# Patient Record
Sex: Male | Born: 1980 | Race: White | Hispanic: No | Marital: Married | Smoking: Current every day smoker
Health system: Southern US, Community
[De-identification: ages and names within clinical notes are randomized; demographics above are authoritative.]

## PROBLEM LIST (undated history)

## (undated) DIAGNOSIS — F32A Depression, unspecified: Secondary | ICD-10-CM

## (undated) DIAGNOSIS — F329 Major depressive disorder, single episode, unspecified: Secondary | ICD-10-CM

## (undated) DIAGNOSIS — F419 Anxiety disorder, unspecified: Secondary | ICD-10-CM

## (undated) HISTORY — DX: Depression, unspecified: F32.A

## (undated) HISTORY — DX: Major depressive disorder, single episode, unspecified: F32.9

## (undated) HISTORY — DX: Anxiety disorder, unspecified: F41.9

---
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
## 2003-02-11 ENCOUNTER — Emergency Department (HOSPITAL_COMMUNITY): Admission: EM | Admit: 2003-02-11 | Discharge: 2003-02-11 | Payer: Self-pay | Admitting: Emergency Medicine

## 2004-09-16 ENCOUNTER — Emergency Department (HOSPITAL_COMMUNITY): Admission: EM | Admit: 2004-09-16 | Discharge: 2004-09-16 | Payer: Self-pay | Admitting: Emergency Medicine

## 2005-01-16 ENCOUNTER — Emergency Department (HOSPITAL_COMMUNITY): Admission: EM | Admit: 2005-01-16 | Discharge: 2005-01-16 | Payer: Self-pay | Admitting: Emergency Medicine

## 2006-05-27 ENCOUNTER — Ambulatory Visit (HOSPITAL_BASED_OUTPATIENT_CLINIC_OR_DEPARTMENT_OTHER): Admission: RE | Admit: 2006-05-27 | Discharge: 2006-05-27 | Payer: Self-pay | Admitting: Otolaryngology

## 2010-11-07 ENCOUNTER — Other Ambulatory Visit: Payer: Self-pay | Admitting: Specialist

## 2013-05-22 ENCOUNTER — Ambulatory Visit (INDEPENDENT_AMBULATORY_CARE_PROVIDER_SITE_OTHER): Payer: BC Managed Care – PPO | Admitting: Family Medicine

## 2013-05-22 VITALS — BP 128/82 | HR 69 | Temp 98.3°F | Resp 16 | Ht 71.0 in | Wt 212.0 lb

## 2013-05-22 DIAGNOSIS — F4323 Adjustment disorder with mixed anxiety and depressed mood: Secondary | ICD-10-CM

## 2013-05-22 DIAGNOSIS — F32A Depression, unspecified: Secondary | ICD-10-CM

## 2013-05-22 DIAGNOSIS — F329 Major depressive disorder, single episode, unspecified: Secondary | ICD-10-CM

## 2013-05-22 DIAGNOSIS — F3289 Other specified depressive episodes: Secondary | ICD-10-CM

## 2013-05-22 MED ORDER — FLUOXETINE HCL 20 MG PO TABS: 20.0000 mg | ORAL_TABLET | Freq: Every day | ORAL | Status: DC

## 2013-05-22 MED ORDER — ALPRAZOLAM 0.25 MG PO TABS: 0.2500 mg | ORAL_TABLET | Freq: Every evening | ORAL | Status: DC | PRN

## 2013-05-22 NOTE — Progress Notes (Signed)
[facility] [address], [place] [address] [phone]  Date:  05/22/2013   Name:  Shawn CruiseRed L Reali   DOB:  1980-10-26   MRN:  [id number]  PCP:  No PCP Per Patient    Chief Complaint: Stress and Insomnia   History of Present Illness:  Shawn Logan is a 33 y.o. very pleasant male patient who presents with the following:  He is here today to discuss stress and depression that he has noted for a couple of months,  He has a hard time sleeping, feels stressed.  His grandfather is living with him and his mother because her grandfather has cancer.  Also, he is on furlough at work.    He does have a GF as well who is supportive.    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   He has had sx like this in the past- about 3 years ago he was treated with some xanax.  This did seem to help him and he was eventually able to stop it.    He is generally healthy He smokes a few cigarettes a day, drinks "may be a couple of beers on the weekend," no drugs.    He has not had any major health problems in the past- no operations He is having a hard time getting to sleep.  Once he goes to sleep he can stay asleep.   His appetite is pretty ok.   He does not have as much enjoyment of activities as he would like.   He does not have any SI.  "I would never do anything like that."    There are no active problems to display for this patient.   Past Medical History  Diagnosis Date  . Anxiety   . Depression     History reviewed. No pertinent past surgical history.  History  Substance Use Topics  . Smoking status: Current Every Day Smoker  . Smokeless tobacco: Not on file  . Alcohol Use: No    History reviewed. No pertinent family history.  No Known Allergies  Medication list has been reviewed and updated.  No current outpatient prescriptions on file prior to visit.   No current facility-administered medications on file prior to visit.    Review of Systems:  As per HPI- otherwise  negative.   Physical Examination: Filed Vitals:   05/22/13 1236  BP: 128/82  Pulse: 69  Temp: 98.3 F (36.8 C)  Resp: 16   Filed Vitals:   05/22/13 1236  Height: 5\' 11"  (1.803 m)  Weight: 212 lb (96.163 kg)   Body mass index is 29.58 kg/(m^2). Ideal Body Weight: Weight in (lb) to have BMI = 25: 178.9  GEN: WDWN, NAD, Non-toxic, A & O x 3 HEENT: Atraumatic, Normocephalic. Neck supple. No masses, No LAD. Ears and Nose: No external deformity. CV: RRR, No M/G/R. No JVD. No thrill. No extra heart sounds. PULM: CTA B, no wheezes, crackles, rhonchi. No retractions. No resp. distress. No accessory muscle use. EXTR: No c/c/e NEURO Normal gait.  PSYCH: Normally interactive. Conversant. Not depressed or anxious appearing.  Calm demeanor.    Assessment and Plan: Depression - Plan: FLUoxetine (PROZAC) 20 MG tablet  Adjustment disorder with mixed anxiety and depressed mood - Plan: ALPRAZolam (XANAX) 0.25 MG tablet  Start prozac, and xanax as needed for sleep.  Cautioned regarding sedation with xanax.   See patient instructions for more details.     Signed Abbe AmsterdamJessica Copland, MD

## 2013-05-22 NOTE — Patient Instructions (Signed)
Start taking the prozac 20 mg- after 2 weeks change to 40 mg a day.   Use the xanax as needed at bedtime.  Remember xanax can be habit forming and will make you feel sedated.  Try to use this for the minimum amount of time that you can.    Please come and see me in about a month to follow-up- Sooner if worse.

## 2014-06-18 ENCOUNTER — Ambulatory Visit (INDEPENDENT_AMBULATORY_CARE_PROVIDER_SITE_OTHER): Payer: BLUE CROSS/BLUE SHIELD | Admitting: Family Medicine

## 2014-06-18 VITALS — BP 136/70 | HR 64 | Temp 98.6°F | Resp 17 | Ht 71.0 in | Wt 209.8 lb

## 2014-06-18 DIAGNOSIS — F4323 Adjustment disorder with mixed anxiety and depressed mood: Secondary | ICD-10-CM | POA: Diagnosis not present

## 2014-06-18 DIAGNOSIS — F329 Major depressive disorder, single episode, unspecified: Secondary | ICD-10-CM | POA: Diagnosis not present

## 2014-06-18 DIAGNOSIS — F32A Depression, unspecified: Secondary | ICD-10-CM

## 2014-06-18 MED ORDER — FLUOXETINE HCL 20 MG PO TABS: 20.0000 mg | ORAL_TABLET | Freq: Every day | ORAL | Status: DC

## 2014-06-18 MED ORDER — ALPRAZOLAM 0.25 MG PO TABS: 0.2500 mg | ORAL_TABLET | Freq: Every evening | ORAL | Status: DC | PRN

## 2014-06-18 NOTE — Progress Notes (Signed)
Subjective:  This chart was scribed for Elvina Sidle MD, by Veverly Fells, at [facility].  This patient was seen in room 13 and the patient's care was started at 9:31 AM.    Patient ID: Shawn Logan, male    DOB: 09-20-1980, 34 y.o.   MRN: [id number] Chief Complaint  Patient presents with   Stress    off and on for years, past medications have worked for him in the past and wants to know if he can start them again.   Anxiety    off and on for years    Headache    2-3 times a week     HPI HPI Comments: Shawn Logan is a 34 y.o. male who [facility] complaining of stress and anxiety intermittently for years.  Patient states that he has increased stress because his sister has been using drugs and he has been taking care of her children.  He mostly feels worried and states that his mind is constantly racing which causes him to lose sleep at night.Per fiance, she thinks that he also feels down and unworthy often. 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  Patient is getting married in October and does not have any children. Patient has used Prozac and Xanax two years ago for about 5 months and states that they have helped him with his symptoms.  He did not feel like he felt the effects of Prozac fully. Patient is a smoker and has been smoking more lately.   He denies any change in appetite, heart racing. Patient works for [employer].   Past Medical History  Diagnosis Date   Anxiety    Depression     Current Outpatient Prescriptions on File Prior to Visit  Medication Sig Dispense Refill   ALPRAZolam (XANAX) 0.25 MG tablet Take 1 tablet (0.25 mg total) by mouth at bedtime as needed for anxiety. (Patient not taking: Reported on 06/18/2014) 30 tablet 1   FLUoxetine (PROZAC) 20 MG tablet Take 1 tablet (20 mg total) by mouth daily. Increase to 2  tablets after 2 weeks (Patient not taking: Reported on 06/18/2014) 60 tablet 3   No current facility-administered medications on file prior to visit.    No Known Allergies   Review of Systems  Constitutional: Negative for fever and chills.  Respiratory: Negative for cough and choking.   Cardiovascular: Negative for chest pain.  Gastrointestinal: Negative for nausea and vomiting.  Musculoskeletal: Negative for neck pain and neck stiffness.  Psychiatric/Behavioral: Positive for sleep disturbance.      Objective:   Physical Exam  Constitutional: He is oriented to person, place, and time. He appears well-developed and well-nourished. No distress.  HENT:  Head: Normocephalic and atraumatic.  Eyes: Conjunctivae and EOM are normal. Pupils are equal, round, and reactive to light.  Cardiovascular: Normal rate, regular rhythm and normal heart sounds.   No murmur heard. Pulmonary/Chest: Effort normal and breath sounds normal. No respiratory distress. He has no wheezes. He has no rales.  Abdominal: There is no tenderness.  Musculoskeletal: Normal range of motion.  Neurological: He is alert and oriented to person, place, and time.  Skin: Skin is warm and dry.  Psychiatric:  He is able to smile but his general body habit is one of shame with his head down and shoulders hunched.   Nursing note and vitals  reviewed.  Filed Vitals:   06/18/14 0901  BP: 136/70  Pulse: 64  Temp: 98.6 F (37 C)  TempSrc: Oral  Resp: 17  Height: 5\' 11"  (1.803 m)  Weight: 209 lb 12.8 oz (95.165 kg)  SpO2: 98%      Assessment & Plan:    This chart was scribed in my presence and reviewed by me personally.    ICD-9-CM ICD-10-CM   1. Depression 311 F32.9 FLUoxetine (PROZAC) 20 MG tablet  2. Adjustment disorder with mixed anxiety and depressed mood 309.28 F43.23 ALPRAZolam (XANAX) 0.25 MG tablet   Patient has a long history of anxiety. He's going to need some techniques to overcome this. The fact that he  did well before on Prozac and Xanax is a promising sign. I spent thirty minutes talking about anxiety with him feel like he is a good candidate for psychotherapy.  I referred the patient to doctors Vernell LeepKen Frazier and or Shanon RosserBarbara Farran for ongoing psychotherapy  Signed, Elvina SidleKurt Lauenstein, MD

## 2014-06-18 NOTE — Patient Instructions (Signed)
2 very good psychologists that you can see are Shawn Logan or Shawn Logan   Generalized Anxiety Disorder Generalized anxiety disorder (GAD) is a mental disorder. It interferes with life functions, including relationships, work, and school. GAD is different from normal anxiety, which everyone experiences at some point in their lives in response to specific life events and activities. Normal anxiety actually helps us prepare for and get through these life events and activities. Normal anxiety goes away after the event or activity is over.  GAD causes anxiety that is not necessarily related to specific events or activities. It also causes excess anxiety in proportion to specific events or activities. The anxiety associated with GAD is also difficult to control. GAD can vary from mild to severe. People with severe GAD can have intense waves of anxiety with physical symptoms (panic attacks).  SYMPTOMS The anxiety and worry associated with GAD are difficult to control. This anxiety and worry are related to many life events and activities and also occur more days than not for 6 months or longer. People with GAD also have three or more of the following symptoms (one or more in children):  Restlessness.   Fatigue.  Difficulty concentrating.   Irritability.  Muscle tension.  Difficulty sleeping or unsatisfying sleep. DIAGNOSIS GAD is diagnosed through an assessment by your health care provider. Your health care provider will ask you questions aboutyour mood,physical symptoms, and events in your life. Your health care provider may ask you about your medical history and use of alcohol or drugs, including prescription medicines. Your health care provider may also do a physical exam and blood tests. Certain medical conditions and the use of certain substances can cause symptoms similar to those associated with GAD. Your health care provider may refer you to a mental health specialist for further  evaluation. TREATMENT The following therapies are usually used to treat GAD:   Medication. Antidepressant medication usually is prescribed for long-term daily control. Antianxiety medicines may be added in severe cases, especially when panic attacks occur.   Talk therapy (psychotherapy). Certain types of talk therapy can be helpful in treating GAD by providing support, education, and guidance. A form of talk therapy called cognitive behavioral therapy can teach you healthy ways to think about and react to daily life events and activities.  Stress managementtechniques. These include yoga, meditation, and exercise and can be very helpful when they are practiced regularly. A mental health specialist can help determine which treatment is best for you. Some people see improvement with one therapy. However, other people require a combination of therapies. Document Released: 05/05/2012 Document Revised: 05/25/2013 Document Reviewed: 05/05/2012 [facility] Patient Information 2015 [employer] [place]. This information is not intended to replace advice given to you by your health care provider. Make sure you discuss any questions you have with your health care provider.

## 2014-10-06 ENCOUNTER — Other Ambulatory Visit: Payer: Self-pay

## 2014-10-06 DIAGNOSIS — F329 Major depressive disorder, single episode, unspecified: Secondary | ICD-10-CM

## 2014-10-06 DIAGNOSIS — F32A Depression, unspecified: Secondary | ICD-10-CM

## 2014-10-06 MED ORDER — FLUOXETINE HCL 20 MG PO TABS: 20.0000 mg | ORAL_TABLET | Freq: Every day | ORAL | Status: DC

## 2014-10-19 ENCOUNTER — Other Ambulatory Visit: Payer: Self-pay | Admitting: Family Medicine

## 2015-03-09 ENCOUNTER — Other Ambulatory Visit: Payer: Self-pay | Admitting: Physician Assistant

## 2015-04-08 ENCOUNTER — Observation Stay (HOSPITAL_COMMUNITY)
Admission: EM | Admit: 2015-04-08 | Discharge: 2015-04-10 | Disposition: A | Discharge status: Home / Self Care | Payer: BLUE CROSS/BLUE SHIELD | Attending: Family Medicine | Admitting: Family Medicine

## 2015-04-08 ENCOUNTER — Encounter (HOSPITAL_COMMUNITY): Payer: Self-pay | Admitting: Emergency Medicine

## 2015-04-08 ENCOUNTER — Emergency Department (HOSPITAL_COMMUNITY): Payer: BLUE CROSS/BLUE SHIELD

## 2015-04-08 DIAGNOSIS — F17200 Nicotine dependence, unspecified, uncomplicated: Secondary | ICD-10-CM | POA: Diagnosis present

## 2015-04-08 DIAGNOSIS — R001 Bradycardia, unspecified: Secondary | ICD-10-CM | POA: Diagnosis not present

## 2015-04-08 DIAGNOSIS — Z6828 Body mass index (BMI) 28.0-28.9, adult: Secondary | ICD-10-CM | POA: Diagnosis not present

## 2015-04-08 DIAGNOSIS — Z720 Tobacco use: Secondary | ICD-10-CM | POA: Diagnosis present

## 2015-04-08 DIAGNOSIS — Z8249 Family history of ischemic heart disease and other diseases of the circulatory system: Secondary | ICD-10-CM | POA: Insufficient documentation

## 2015-04-08 DIAGNOSIS — R42 Dizziness and giddiness: Secondary | ICD-10-CM | POA: Insufficient documentation

## 2015-04-08 DIAGNOSIS — I959 Hypotension, unspecified: Secondary | ICD-10-CM | POA: Diagnosis not present

## 2015-04-08 DIAGNOSIS — E669 Obesity, unspecified: Secondary | ICD-10-CM | POA: Diagnosis not present

## 2015-04-08 DIAGNOSIS — F419 Anxiety disorder, unspecified: Secondary | ICD-10-CM | POA: Insufficient documentation

## 2015-04-08 DIAGNOSIS — R079 Chest pain, unspecified: Secondary | ICD-10-CM | POA: Insufficient documentation

## 2015-04-08 DIAGNOSIS — F329 Major depressive disorder, single episode, unspecified: Secondary | ICD-10-CM | POA: Diagnosis not present

## 2015-04-08 DIAGNOSIS — D72829 Elevated white blood cell count, unspecified: Secondary | ICD-10-CM | POA: Diagnosis not present

## 2015-04-08 DIAGNOSIS — E785 Hyperlipidemia, unspecified: Secondary | ICD-10-CM | POA: Insufficient documentation

## 2015-04-08 DIAGNOSIS — F17210 Nicotine dependence, cigarettes, uncomplicated: Secondary | ICD-10-CM | POA: Insufficient documentation

## 2015-04-08 DIAGNOSIS — F1090 Alcohol use, unspecified, uncomplicated: Secondary | ICD-10-CM | POA: Insufficient documentation

## 2015-04-08 DIAGNOSIS — Z7289 Other problems related to lifestyle: Secondary | ICD-10-CM | POA: Insufficient documentation

## 2015-04-08 DIAGNOSIS — R55 Syncope and collapse: Principal | ICD-10-CM | POA: Insufficient documentation

## 2015-04-08 DIAGNOSIS — Z789 Other specified health status: Secondary | ICD-10-CM | POA: Insufficient documentation

## 2015-04-08 LAB — CBC
HCT: 47.1 % (ref 39.0–52.0)
Hemoglobin: 16 g/dL (ref 13.0–17.0)
MCH: 28.5 pg (ref 26.0–34.0)
MCHC: 34 g/dL (ref 30.0–36.0)
MCV: 83.8 fL (ref 78.0–100.0)
PLATELETS: 189 10*3/uL (ref 150–400)
RBC: 5.62 MIL/uL (ref 4.22–5.81)
RDW: 13 % (ref 11.5–15.5)
WBC: 11 10*3/uL — ABNORMAL HIGH (ref 4.0–10.5)

## 2015-04-08 LAB — URINALYSIS, ROUTINE W REFLEX MICROSCOPIC
Bilirubin Urine: NEGATIVE
GLUCOSE, UA: NEGATIVE mg/dL
HGB URINE DIPSTICK: NEGATIVE
KETONES UR: NEGATIVE mg/dL
Leukocytes, UA: NEGATIVE
Nitrite: NEGATIVE
PROTEIN: NEGATIVE mg/dL
Specific Gravity, Urine: 1.009 (ref 1.005–1.030)
pH: 6.5 (ref 5.0–8.0)

## 2015-04-08 LAB — BASIC METABOLIC PANEL
ANION GAP: 15 (ref 5–15)
BUN: 12 mg/dL (ref 6–20)
CALCIUM: 8.9 mg/dL (ref 8.9–10.3)
CO2: 19 mmol/L — ABNORMAL LOW (ref 22–32)
Chloride: 106 mmol/L (ref 101–111)
Creatinine, Ser: 1.09 mg/dL (ref 0.61–1.24)
GFR calc Af Amer: >60 mL/min (ref >60)
GLUCOSE: 90 mg/dL (ref 65–99)
Potassium: 4.1 mmol/L (ref 3.5–5.1)
SODIUM: 140 mmol/L (ref 135–145)

## 2015-04-08 LAB — RAPID URINE DRUG SCREEN, HOSP PERFORMED
Amphetamines: NOT DETECTED
Barbiturates: NOT DETECTED
Benzodiazepines: NOT DETECTED
Cocaine: NOT DETECTED
OPIATES: NOT DETECTED
Tetrahydrocannabinol: POSITIVE — AB

## 2015-04-08 LAB — CBG MONITORING, ED: Glucose-Capillary: 85 mg/dL (ref 65–99)

## 2015-04-08 NOTE — ED Provider Notes (Signed)
CSN: [id number]     Arrival date & time 04/08/15  1953 History   First MD Initiated Contact with Patient 04/08/15 1959     Chief Complaint  Patient presents with  . Loss of Consciousness      The history is provided by the patient.  Patient is a healthy 35 year old male with no significant past medical history including no family history of early cardiac disease who presents the emergency department after a syncopal episode.  He began with 15-20 minutes of severe anterior chest pressure and discomfort without radiation followed by a sensation of lightheadedness.  He then had a syncopal episode where he lost complete consciousness and fell to the ground per the wife.  She states that after 2030 seconds he came to with wide eyes and had no recollection of the events.  She states that he is extremely pale and diaphoretic.  On EMS arrival the patient's blood pressure was 80/40 and his heart rate was in the 40s.  This time he feels much better.  He is still having a mild amount of tightness in the left side of his chest.  No recent illness.  Patient has had a normal week.  He does smoke cigarettes.  He denies cocaine use.  No headache at this time.  Denies pain.    Past Medical History  Diagnosis Date  . Anxiety   . Depression    History reviewed. No pertinent past surgical history. History reviewed. No pertinent family history. Social History  Substance Use Topics  . Smoking status: Current Every Day Smoker -- 1.00 packs/day    Types: Cigarettes  . Smokeless tobacco: None  . Alcohol Use: 7.2 oz/week    12 Cans of beer per week     Comment: weekend    Review of Systems  All other systems reviewed and are negative.     Allergies  Review of patient's allergies indicates no known allergies.  Home Medications   Prior to Admission medications   Medication Sig Start Date End Date Taking? Authorizing Provider  ALPRAZolam (XANAX) 0.25 MG tablet TAKE 1 TABLET BY MOUTH AT BEDTIME AS  NEEDED FOR ANXIETY. Patient not taking: Reported on 04/08/2015 10/20/14   Elvina Sidle, MD  FLUoxetine (PROZAC) 20 MG tablet Take 1 tablet (20 mg total) by mouth daily. Increase to 2 tablets after 2 weeks. PATIENT NEEDS OFFICE VISIT FOR ADDITIONAL REFILLS Patient not taking: Reported on 04/08/2015 10/06/14   Elvina Sidle, MD   BP 127/81 mmHg  Pulse 66  Temp(Src) 98.4 F (36.9 C) (Oral)  Resp 15  Ht  (1.854 m)  Wt 220 lb (99.791 kg)  BMI 29.03 kg/m2  SpO2 99% Physical Exam  Constitutional: He is oriented to person, place, and time. He appears well-developed and well-nourished.  HENT:  Head: Normocephalic and atraumatic.  Eyes: EOM are normal.  Neck: Normal range of motion.  Cardiovascular: Normal rate, regular rhythm, normal heart sounds and intact distal pulses.   Pulmonary/Chest: Effort normal and breath sounds normal. No respiratory distress.  Abdominal: Soft. He exhibits no distension. There is no tenderness.  Musculoskeletal: Normal range of motion.  Neurological: He is alert and oriented to person, place, and time.  Skin: Skin is warm and dry.  Psychiatric: He has a normal mood and affect. Judgment normal.  Nursing note and vitals reviewed.   ED Course  Procedures (including critical care time) Labs Review Labs Reviewed  BASIC METABOLIC PANEL - Abnormal; Notable for the following:  CO2 19 (*)    All other components within normal limits  CBC - Abnormal; Notable for the following:    WBC 11.0 (*)    All other components within normal limits  URINE RAPID DRUG SCREEN, HOSP PERFORMED - Abnormal; Notable for the following:    Tetrahydrocannabinol POSITIVE (*)    All other components within normal limits  URINALYSIS, ROUTINE W REFLEX MICROSCOPIC (NOT AT [facility])  TROPONIN I  CBG MONITORING, ED  Rosezena SensorI-STAT TROPOININ, ED    Imaging Review Dg Chest Portable 1 View  04/08/2015  CLINICAL DATA:  Chest pain EXAM: PORTABLE CHEST 1 VIEW COMPARISON:  01/16/2005 FINDINGS:  Cardiac shadow is enlarged in part by the portable technique. The lungs are well aerated with crowding of the vascular markings due to poor inspiratory effort. No focal infiltrate is seen. No acute bony abnormality is noted. IMPRESSION: No active disease. Electronically Signed   By: Alcide CleverMark  Lukens M.D.   On: 04/08/2015 21:27   I have personally reviewed and evaluated these images and lab results as part of my medical decision-making.  EKG Interpretation #1 Date/Time:  Friday April 08 2015 19:58:48 EDT Ventricular Rate:  65 PR Interval:  178 QRS Duration: 102 QT Interval:  402 QTC Calculation: 418 R Axis:   17 Text Interpretation:  Sinus rhythm No old tracing to compare Confirmed by  Idona Stach  MD, Josemanuel Eakins ([id number]) on 04/08/2015 8:30:32 PM     EKG Interpretation #2  Date/Time:  Friday April 08 2015 21:02:32 EDT Ventricular Rate:  66 PR Interval:  182 QRS Duration: 101 QT Interval:  404 QTC Calculation: 423 R Axis:   21 Text Interpretation:  Sinus rhythm No significant change was found Confirmed by Rakeem Colley  MD, Caryn BeeKEVIN ([id number]) on 04/08/2015 10:07:25 PM          MDM   Final diagnoses:  Syncope, unspecified syncope type  Chest pain, unspecified chest pain type    Concerning story for syncope.  Syncope with preceding chest pain followed by complete loss of consciousness.  He is bradycardic and hypotensive for any masses well.  Differential includes ventricular tachycardia or other abnormal ventricular arrhythmia versus vagal episode with severe GERD.  Given the concerning story he'll be observed overnight on telemetry.  Troponin negative.    Azalia BilisKevin Jeni Duling, MD 04/09/15 [phone]

## 2015-04-08 NOTE — ED Notes (Signed)
Pt brought to ED from home after having a syncope episode at home that last 30 sec while he was having dinner with his wife, pt was very diaphoretic, had an incontinence from urine at this time, pt denies any hx of seizures, BP 80/40, HR on 40's on EMS arrival to his house. Pt denies any pain, fever, chills, n/v/d.

## 2015-04-09 ENCOUNTER — Other Ambulatory Visit: Payer: Self-pay

## 2015-04-09 ENCOUNTER — Encounter (HOSPITAL_COMMUNITY): Payer: Self-pay

## 2015-04-09 DIAGNOSIS — E669 Obesity, unspecified: Secondary | ICD-10-CM | POA: Diagnosis not present

## 2015-04-09 DIAGNOSIS — Z720 Tobacco use: Secondary | ICD-10-CM | POA: Diagnosis not present

## 2015-04-09 DIAGNOSIS — R079 Chest pain, unspecified: Secondary | ICD-10-CM | POA: Diagnosis not present

## 2015-04-09 DIAGNOSIS — D72829 Elevated white blood cell count, unspecified: Secondary | ICD-10-CM | POA: Diagnosis present

## 2015-04-09 DIAGNOSIS — E785 Hyperlipidemia, unspecified: Secondary | ICD-10-CM | POA: Diagnosis not present

## 2015-04-09 DIAGNOSIS — F17200 Nicotine dependence, unspecified, uncomplicated: Secondary | ICD-10-CM | POA: Diagnosis present

## 2015-04-09 DIAGNOSIS — R55 Syncope and collapse: Secondary | ICD-10-CM | POA: Diagnosis present

## 2015-04-09 LAB — CBC WITH DIFFERENTIAL/PLATELET
BASOS ABS: 0.1 10*3/uL (ref 0.0–0.1)
BASOS PCT: 0 %
EOS ABS: 0.4 10*3/uL (ref 0.0–0.7)
Eosinophils Relative: 4 %
HCT: 45.5 % (ref 39.0–52.0)
HEMOGLOBIN: 15.2 g/dL (ref 13.0–17.0)
Lymphocytes Relative: 33 %
Lymphs Abs: 3.8 10*3/uL (ref 0.7–4.0)
MCH: 28.1 pg (ref 26.0–34.0)
MCHC: 33.4 g/dL (ref 30.0–36.0)
MCV: 84.1 fL (ref 78.0–100.0)
MONOS PCT: 9 %
Monocytes Absolute: 1.1 10*3/uL — ABNORMAL HIGH (ref 0.1–1.0)
NEUTROS ABS: 6.1 10*3/uL (ref 1.7–7.7)
NEUTROS PCT: 54 %
Platelets: 225 10*3/uL (ref 150–400)
RBC: 5.41 MIL/uL (ref 4.22–5.81)
RDW: 13.2 % (ref 11.5–15.5)
WBC: 11.5 10*3/uL — AB (ref 4.0–10.5)

## 2015-04-09 LAB — TROPONIN I: Troponin I: <0.03 ng/mL (ref <0.031)

## 2015-04-09 LAB — LIPID PANEL
CHOLESTEROL: 163 mg/dL (ref 0–200)
HDL: 34 mg/dL — AB (ref >40)
LDL Cholesterol: 85 mg/dL (ref 0–99)
TRIGLYCERIDES: 221 mg/dL — AB (ref <150)
Total CHOL/HDL Ratio: 4.8 RATIO
VLDL: 44 mg/dL — AB (ref 0–40)

## 2015-04-09 LAB — I-STAT TROPONIN, ED: Troponin i, poc: 0.01 ng/mL (ref 0.00–0.08)

## 2015-04-09 LAB — D-DIMER, QUANTITATIVE (NOT AT ARMC)

## 2015-04-09 LAB — MAGNESIUM: Magnesium: 1.9 mg/dL (ref 1.7–2.4)

## 2015-04-09 MED ORDER — VITAMIN B-1 100 MG PO TABS
100.0000 mg | ORAL_TABLET | Freq: Every day | ORAL | Status: DC
  Administered 2015-04-09: 100 mg via ORAL
  Filled 2015-04-09 (×2): qty 1

## 2015-04-09 MED ORDER — THIAMINE HCL 100 MG/ML IJ SOLN: 100.0000 mg | Freq: Every day | INTRAMUSCULAR | Status: DC

## 2015-04-09 MED ORDER — ASPIRIN EC 81 MG PO TBEC
81.0000 mg | DELAYED_RELEASE_TABLET | Freq: Every day | ORAL | Status: DC
  Administered 2015-04-10: 81 mg via ORAL
  Filled 2015-04-09: qty 1

## 2015-04-09 MED ORDER — ACETAMINOPHEN 325 MG PO TABS: 650.0000 mg | ORAL_TABLET | ORAL | Status: DC | PRN

## 2015-04-09 MED ORDER — NICOTINE 14 MG/24HR TD PT24
14.0000 mg | MEDICATED_PATCH | Freq: Every day | TRANSDERMAL | Status: DC
  Administered 2015-04-09: 14 mg via TRANSDERMAL
  Filled 2015-04-09 (×2): qty 1

## 2015-04-09 MED ORDER — ADULT MULTIVITAMIN W/MINERALS CH
1.0000 | ORAL_TABLET | Freq: Every day | ORAL | Status: DC
  Administered 2015-04-09: 1 via ORAL
  Filled 2015-04-09 (×2): qty 1

## 2015-04-09 MED ORDER — LORAZEPAM 2 MG/ML IJ SOLN: 1.0000 mg | Freq: Four times a day (QID) | INTRAMUSCULAR | Status: DC | PRN

## 2015-04-09 MED ORDER — ONDANSETRON HCL 4 MG/2ML IJ SOLN: 4.0000 mg | Freq: Four times a day (QID) | INTRAMUSCULAR | Status: DC | PRN

## 2015-04-09 MED ORDER — ENOXAPARIN SODIUM 40 MG/0.4ML ~~LOC~~ SOLN
40.0000 mg | Freq: Every day | SUBCUTANEOUS | Status: DC
  Filled 2015-04-09 (×2): qty 0.4

## 2015-04-09 MED ORDER — ASPIRIN EC 325 MG PO TBEC
325.0000 mg | DELAYED_RELEASE_TABLET | Freq: Every day | ORAL | Status: DC
  Administered 2015-04-09: 325 mg via ORAL
  Filled 2015-04-09: qty 1

## 2015-04-09 MED ORDER — LORAZEPAM 1 MG PO TABS
1.0000 mg | ORAL_TABLET | Freq: Four times a day (QID) | ORAL | Status: DC | PRN
  Administered 2015-04-09: 1 mg via ORAL
  Filled 2015-04-09: qty 1

## 2015-04-09 MED ORDER — FOLIC ACID 1 MG PO TABS
1.0000 mg | ORAL_TABLET | Freq: Every day | ORAL | Status: DC
  Administered 2015-04-09: 1 mg via ORAL
  Filled 2015-04-09 (×2): qty 1

## 2015-04-09 NOTE — Progress Notes (Signed)
Family Medicine Teaching Service Daily Progress Note Intern Pager: [phone]  Patient name: Shawn Logan Medical record number: [id number] Date of birth: 1981-01-17 Age: 35 y.o. Gender: male  Primary Care Provider: No PCP Per Patient Consultants: none Code Status: Full  Pt Overview and Major Events to Date:  3/18 (overnight) - admitted to [facility] for Syncope and Chest Pain, negative initial work-up 3/18 - Transferred to FPTS (followed by [facility]), troponins/EKG negative, no active chest pain  Assessment and Plan:  Syncopal episode, witnessed, unclear etiology suspected vasovagal Acute episode of syncope without residual symptoms, no evidence of head injury or sequela as was sitting at time of episode. No recurrent episodes. Initial chest pressure prior to episode, since resolved, see below. On admit, documented bradycardia and hypotension, since resolved. Remote history of vasovagal syncope before, seems most likely. Less likely but primary concern for cardiogenic/arrhythmia as etiology, currently without evidence on telemetry, hemodynamically stable with normal HR. Unlikely neurogenic, no focal neuro deficits, unlikely seizure without post-ictal or other manifestations. - Observation, on telemetry monitoring overnight - Initial orthostatic vitals negative for hypotension or tachycardia - Lab work-up unremarkable (negative, d-dimer, troponins, chemistry) - ECHO ordered on admit, no evidence of LVH on EKG, suspect low yield but may proceed - UDS positive THC, otherwise no drugs to explain episode - Consider future outpatient Cardiology consult if further recurrent syncope or other concerning symptoms  Chest Pain, Rule out MI - Resolved No active CP currently. Since resolved on admit. S/p ASA. Patient was hypotensive and bradycardic initially, since resolved. - Initial work-up unremarkable with negative Troponin and EKG without ischemic changes. - Troponin trend continued negative  x 2, ordered one last to complete cycle - Repeat EKG unremarkable  Tobacco Abuse, h/o THC use Active smoker. Anxious about not being able to smoke cigarette. UDS positive THC otherwise negative. - Ordered Nicotine patch  daily - Counseled on smoking cessation  H/o Alcohol use Denies daily drinking, but significant 12 beers every weekend, had 3-4 beers prior to initial syncopal episode. - Ordered CIWA protocol, may receive ativan PRN - Prior history on Xanax PRN off for >3 months now  FEN/GI: Carb modified diet, SLIV PPx: Lovenox SQ  Disposition: Admitted observation overnight for syncopal episode, continue monitor 24 hour observation on telemetry, rule out MI with chest pain (since resolved), ECHO pending, if remains stable in AM anticipate discharge to home.  Subjective:  Patient reports that he feels fine currently, and interested in going home later, as he is admittedly anxious about being in the hospital and not able to go outside and smoke. Denies any active chest pain or pressure, shortness of breath, recurrence of any syncopal symptoms, edema. Admits to tolerating ambulating 10-15 feet in room, when turned around felt a "little dizzy" but denied room spinning, quickly resolved on sitting back down. Denied exertional chest pain. Tolerated PO.  Objective: Temp:  [98.3 F (36.8 C)-98.4 F (36.9 C)] 98.3 F (36.8 C) (03/18 0215) Pulse Rate:  [58-78] 58 (03/18 0215) Resp:  [13-24] 20 (03/18 0215) BP: (94-127)/(51-100) 112/62 mmHg (03/18 0215) SpO2:  [95 %-99 %] 96 % (03/18 0215) Weight:  [219 lb 6.4 oz (99.519 kg)-220 lb (99.791 kg)] 219 lb 6.4 oz (99.519 kg) (03/18 0215) Physical Exam: General: well-appearing, overweight, comfortable, cooperative, wife at bedside HEENT: atraumatic, oropharynx clear, PERRL, EOMI Cardiovascular: RRR (earlier mild bradycardia), no murmurs, rubs, or gallops Chest: Non-tender chest wall Respiratory: CTAB, good air movement. Abdomen: soft NTND,  +active BS Extremities: moves all  ext equally, calves non-tender, no lower ext edema, peripheral pulses +2 bilaterally Skin: warm, dry  Laboratory:  Recent Labs Lab 04/08/15 2021 04/09/15 0339  WBC 11.0* 11.5*  HGB 16.0 15.2  HCT 47.1 45.5  PLT 189 225    Recent Labs Lab 04/08/15 2021  NA 140  K 4.1  CL 106  CO2 19*  BUN 12  CREATININE 1.09  CALCIUM 8.9  GLUCOSE 90  Magnesium 1.9  UA - neg leuks, neg hgb, neg nitrite, protein neg, spec grav 1.009  UDS - positive THC, otherwise negative  Troponin-poc 0.01 Troponin-I < 0.03 x 2  D-dimer < 0.27 (negative)  Lipid Profile - TC 163, TG 221, HDL 34, LDL 85  Imaging/Diagnostic Tests:  3/17 2126 Chest X-ray Portable 1v FINDINGS: Cardiac shadow is enlarged in part by the portable technique. The lungs are well aerated with crowding of the vascular markings due to poor inspiratory effort. No focal infiltrate is seen. No acute bony abnormality is noted. IMPRESSION: No active disease.  3/17 ED EKG NSR HR 66, no acute ST-T wave changes, no significant change.  3/18 0230 Repeat EKG Sinus bradycardia HR 56, only change is non-specific T wave flattening isolated lead V4, otherwise no acute ST-T changes.  Smitty CordsAlexander J Renella Steig, DO 04/09/2015, 11:18 AM PGY-3, [facility] F[facility] Intern pager: [phone], text pages welcome

## 2015-04-09 NOTE — Discharge Summary (Signed)
[facility]  Patient name: Shawn CruiseRed L Persichetti Medical record number: [id number] Date of birth: 07-Aug-1980 Age: 35 y.o. Gender: male Date of Admission: 04/08/2015  Date of Discharge: 04/10/2015 Admitting Physician: Clydie Braunondell A Smith, MD / Attending - Pearlean BrownieMarshall Chambliss, MD  Primary Care Provider: [facility] Consultants: None  Indication for Hospitalization: Syncopal Episode  Discharge Diagnoses/Problem List:  Syncopal episode, witnessed, suspected vasovagal etiology - resolved Chest Pain, Ruled out MI - resolved Dyslipidemia Tobacco Abuse, h/o THC use H/o frequent Alcohol use, without history of dependence  Disposition: Home  Discharge Condition: Stable  Discharge Exam:   General: well-appearing, overweight, appears more comfortable and more energetic today, cooperative HEENT: atraumatic, oropharynx clear, PERRL, EOMI Cardiovascular: RRR (earlier mild bradycardia), no murmurs, rubs, or gallops Chest: Non-tender chest wall Respiratory: CTAB, good air movement. Abdomen: soft NTND, +active BS Extremities: moves all ext equally, calves non-tender, no lower ext edema, peripheral pulses +2 bilaterally Skin: warm, dry Neuro: awake, alert, oriented, grossly non-focal  Brief Hospital Course:   Shawn Pricilla Holmucker is a [age] yr male who presented to ED with an acute syncopal episode lasting < 1 min on 04/08/15 evening, symptoms prior with some chest pressure and lightheadedness immediately before, otherwise he had been well. He arrived to ED via EMS, initial eval by EMS with hypotensive BP 80/40, bradycardia with HR 40s, he was stabilized in ED with improvement but only had residual left chest pressure, initial work-up with CXR unremarkable, troponin and EKG NSR without acute ST-T changes in ED. Admitted to [facility] overnight on telemetry for observation for syncope and chest pain rule out, with concern of potential cardiogenic syncope  from possible ventricular arrhythmia.  During hospitalization, patient was transferred on 04/09/15 to [facility] to resume primary management [facility] Patient remained relatively asymptomatic during hospital course only some occasional dizziness on ambulation, otherwise without any further episodes of chest pain or pressure, pre-syncope or syncope. Work-up with orthostatic vitals were negative for hypotension or tachycardia. Ruled out MI with negative troponin cycle and EKG. ECHO completed (04/10/15, read is pending). Patient's primary concern was anxiety over unable to smoke, on nicotine patch, concern with frequent alcohol consumption, placed on CIWA with negative scores 0 to 3.  Stable for discharge to home with tolerating ambulation much better without dizziness, hemodynamically stable. Given reassurance, unlikely cardiogenic syncope from arrhythmia without recurrence on reviewed telemetry x 24 hours, however return precautions given.  Issues for Follow Up:  1. ECHO results 2. If recurrent syncope or other cardiac etiology, consider outpatient Cardiology referral for possible stress test vs event monitor 3. Dyslipidemia - unable to use ASCVD risk calc age < 340 but extrapolating does not meet criteria for statin 4. Tobacco / Alcohol cessation counseling - given rx NRT with nicotine patches 14mg   Significant Procedures: none  Significant Labs and Imaging:   Recent Labs Lab 04/08/15 2021 04/09/15 0339  WBC 11.0* 11.5*  HGB 16.0 15.2  HCT 47.1 45.5  PLT 189 225    Recent Labs Lab 04/08/15 2021 04/09/15 0339 04/10/15 0232  NA 140  --  140  K 4.1  --  3.7  CL 106  --  109  CO2 19*  --  23  GLUCOSE 90  --  108*  BUN 12  --  12  CREATININE 1.09  --  0.93  CALCIUM 8.9  --  9.1  MG  --  1.9  --    Magnesium  1.9  UA - neg leuks, neg hgb, neg nitrite, protein neg, spec grav 1.009  UDS - positive THC, otherwise negative  Troponin-poc 0.01 Troponin-I < 0.03 x  3  D-dimer < 0.27 (negative)  Lipid Profile - TC 163, TG 221, HDL 34, LDL 85  Imaging/Diagnostic Tests:  3/17 2126 Chest X-ray Portable 1v FINDINGS: Cardiac shadow is enlarged in part by the portable technique. The lungs are well aerated with crowding of the vascular markings due to poor inspiratory effort. No focal infiltrate is seen. No acute bony abnormality is noted. IMPRESSION: No active disease.  3/17 ED EKG NSR HR 66, no acute ST-T wave changes, no significant change.  3/18 0230 Repeat EKG Sinus bradycardia HR 56, only change is non-specific T wave flattening isolated lead V4, otherwise no acute ST-T changes.  Results/Tests Pending at Time of Discharge:   ECHOcardiogram (completed 04/10/15, result is pending)  Discharge Medications:    Medication List    ASK your doctor about these medications        ALPRAZolam 0.25 MG tablet  Commonly known as:  XANAX  TAKE 1 TABLET BY MOUTH AT BEDTIME AS NEEDED FOR ANXIETY.     FLUoxetine 20 MG tablet  Commonly known as:  PROZAC  Take 1 tablet (20 mg total) by mouth daily. Increase to 2 tablets after 2 weeks. PATIENT NEEDS OFFICE VISIT FOR ADDITIONAL REFILLS        Discharge Instructions: Please refer to Patient Instructions section of EMR for full details.  Patient was counseled important signs and symptoms that should prompt return to medical care, changes in medications, dietary instructions, activity restrictions, and follow up appointments.   Follow-Up Appointments: Follow-up Information    Follow [facility] with [facility] In [facility] week.   Why:  For hospital follow-up   Contact information:   [address] [place] [address] [phone]      Smitty Cords, DO 04/10/2015, 10:42 AM PGY-3, [facility]

## 2015-04-09 NOTE — H&P (Addendum)
[facility] History and Physical  Shawn Logan ZOX:[id number] DOB: [birth year] DOA: 04/08/2015  Referring physician: ED PCP: No PCP Per Patient   Chief Complaint: Syncope  HPI:  Mr. Shawn Logan is a 35 year old male with a past medical history significant for family history early cardiac disease, tobacco abuse and obesity; who presents after having a syncopal episode with noted complaints of chest pain immediately prior to. Patient had been noted to have developed pressure like chest pain on the left-hand side in the car on his way to eat dinner with his wife. Initially thought symptoms were just likely heartburn. Reports duration of chest pain symptoms as approximately 15-20 minutes. They were unable to sit down in a booth at the restaurant and he had been able to take a couple bites of his food before noting worsening chest pain. He complained of feeling lightheaded immediately before losing consciousness hitting his head on wall in the booth. He was unconscious for approximately 30 seconds to a minute. His wife explains that he may have had some shaking during this period in time, but she cannot remember exactly. He was incontinent of urine, but not bowel and there was no note of any tongue biting. Once he came too he was very pale and diaphoretic and complained of still feeling lightheaded. No postictal state was express. Once EMS arrived his blood pressure was noted to be 80/40 with a heart rate in the 40s. Patient's mother notes a significant family history of his maternal grandmother dying at age of 35 from a massive heart attack, maternal great uncle dying from a massive heart attack at age of 35, and possibly his maternal uncle requiring stent placement at the age of 35. The patient denies any recent travel, illnesses, leg pain/ swelling, known family history of blood disorders. Patient does endorse smoking cigarettes on average1 pack daily. He drinks usually a 12 pack on the weekends.   Upon  admission patient is evaluated and found to have a UDS that was positive for marijuana, WBC 11, chest x-ray showing enlarged cardiac silhouette reported to be secondary to imaging technique, troponin negative, EKG showing normal sinus rhythm.   Review of Systems  Constitutional: Positive for diaphoresis. Negative for fever, chills and malaise/fatigue.  HENT: Negative for ear pain and tinnitus.   Eyes: Negative for double vision, photophobia and pain.  Respiratory: Negative for sputum production and shortness of breath.   Cardiovascular: Positive for chest pain. Negative for leg swelling.  Gastrointestinal: Negative for abdominal pain and diarrhea.  Genitourinary: Negative for urgency and frequency.  Musculoskeletal: Negative for back pain and neck pain.  Skin: Negative for itching and rash.  Neurological: Positive for loss of consciousness. Negative for sensory change and speech change.  Endo/Heme/Allergies: Negative for environmental allergies. Does not bruise/bleed easily.  Psychiatric/Behavioral: Positive for substance abuse. Negative for hallucinations. The patient does not have insomnia.        Past Medical History  Diagnosis Date  . Anxiety   . Depression      History reviewed. No pertinent past surgical history.    Social History:  reports that he has been smoking Cigarettes.  He has been smoking about 1.00 pack per day. He does not have any smokeless tobacco history on file. He reports that he drinks about 7.2 oz of alcohol per week. He reports that he does not use illicit drugs. Where does patient live--home  and with whom if at home?wife Can patient participate in ADLs?yes   No Known Allergies  History reviewed. No pertinent family history.     Prior to Admission medications   Medication Sig Start Date End Date Taking? Authorizing Provider  ALPRAZolam (XANAX) 0.25 MG tablet TAKE 1 TABLET BY MOUTH AT BEDTIME AS NEEDED FOR ANXIETY. Patient not taking: Reported on  04/08/2015 10/20/14   Elvina Sidle, MD  FLUoxetine (PROZAC) 20 MG tablet Take 1 tablet (20 mg total) by mouth daily. Increase to 2 tablets after 2 weeks. PATIENT NEEDS OFFICE VISIT FOR ADDITIONAL REFILLS Patient not taking: Reported on 04/08/2015 10/06/14   Elvina Sidle, MD     Physical Exam: Filed Vitals:   04/09/15 0015 04/09/15 0030 04/09/15 0045 04/09/15 0100  BP: 108/63 105/65 110/71 111/70  Pulse: 64 66 75 66  Temp:      TempSrc:      Resp: Height:      Weight:      SpO2: 95% 95% 97% 98%     Constitutional: Vital signs reviewed. Patient is a well-developed and well-nourished in no acute distress and cooperative with exam. Alert and oriented x3.  Head: Normocephalic and atraumatic  Ear: TM normal bilaterally  Mouth: no erythema or exudates, MMM  Eyes: PERRL, EOMI, conjunctivae normal, No scleral icterus.  Neck: Supple, Trachea midline normal ROM, No JVD, mass, thyromegaly, or carotid bruit present.  Cardiovascular: RRR, S1 normal, S2 normal, no MRG, pulses symmetric and intact bilaterally  Pulmonary/Chest: CTAB, no wheezes, rales, or rhonchi  Abdominal: Soft. Non-tender, non-distended, bowel sounds are normal, no masses, organomegaly, or guarding present.  GU: no CVA tenderness Musculoskeletal: No joint deformities, erythema, or stiffness, ROM full and no nontender Ext: no edema and no cyanosis, pulses palpable bilaterally (DP and PT)  Hematology: no cervical, inginal, or axillary adenopathy.  Neurological: A&O x3, Strenght is normal and symmetric bilaterally, cranial nerve II-XII are grossly intact, no focal motor deficit, sensory intact to light touch bilaterally.  Skin: Warm, dry and intact. No rash, cyanosis, or clubbing.  Psychiatric: Normal mood and affect. speech and behavior is normal. Judgment and thought content normal. Cognition and memory are normal.      Data Review   Micro Results No results found for this or any previous visit (from the  past 240 hour(s)).  Radiology Reports Dg Chest Portable 1 View  04/08/2015  CLINICAL DATA:  Chest pain EXAM: PORTABLE CHEST 1 VIEW COMPARISON:  01/16/2005 FINDINGS: Cardiac shadow is enlarged in part by the portable technique. The lungs are well aerated with crowding of the vascular markings due to poor inspiratory effort. No focal infiltrate is seen. No acute bony abnormality is noted. IMPRESSION: No active disease. Electronically Signed   By: Alcide Clever M.D.   On: 04/08/2015 21:27     CBC  Recent Labs Lab 04/08/15 2021  WBC 11.0*  HGB 16.0  HCT 47.1  PLT 189  MCV 83.8  MCH 28.5  MCHC 34.0  RDW 13.0    Chemistries   Recent Labs Lab 04/08/15 2021  NA 140  K 4.1  CL 106  CO2 19*  GLUCOSE 90  BUN 12  CREATININE 1.09  CALCIUM 8.9   ------------------------------------------------------------------------------------------------------------------ estimated creatinine clearance is 118.7 mL/min (by C-G formula based on Cr of 1.09). ------------------------------------------------------------------------------------------------------------------ No results for input(s): HGBA1C in the last 72 hours. ------------------------------------------------------------------------------------------------------------------ No results for input(s): CHOL, HDL, LDLCALC, TRIG, CHOLHDL, LDLDIRECT in the last 72 hours. ------------------------------------------------------------------------------------------------------------------ No results for input(s): TSH, T4TOTAL, T3FREE, THYROIDAB in the last 72 hours.  Invalid input(s): FREET3 ------------------------------------------------------------------------------------------------------------------  No results for input(s): VITAMINB12, FOLATE, FERRITIN, TIBC, IRON, RETICCTPCT in the last 72 hours.  Coagulation profile No results for input(s): INR, PROTIME in the last 168 hours.  No results for input(s): DDIMER in the last 72  hours.  Cardiac Enzymes No results for input(s): CKMB, TROPONINI, MYOGLOBIN in the last 168 hours.  Invalid input(s): CK ------------------------------------------------------------------------------------------------------------------ Invalid input(s): POCBNP   CBG:  Recent Labs Lab 04/08/15 2018  GLUCAP 85       EKG: Independently reviewed. Normal sinus rhythm Assessment/Plan Syncope: Acute. Patient history of substernal chest pressure prior to acute symptoms given family history of possible early cardiac death. Differential at this time includes vasovagal versus - Admit to a telemetry bed - Check d-dimer stat - Recheck orthostatic vital signs  Chest pain: Acute. Patient's symptoms were chest pressure or on the left-hand side. Initial EKG showing normal sinus rhythm and troponin negative. Differential includes hypertrophic cardiomyopathy versus acute ischemia versus arrhythmia versus severe GERD. - Trending cardiac troponins x 3 q 3 hrs - Check echocardiogram in a.m. - Recheck EKG at 6 AM - Consult cardiology if warranted in a.m.  Bradycardia and hypotension : Now resolved   Leukocytosis: WBC mildly elevated at 11. Patient denies any recent sickness or illness. UA and chest x-ray showed no acute signs of infection. - Continue to monitor  Obesity  - Check lipid panel   Tobacco abuse - will need counseling on cessation of tobacco at some point during hospitalization    Code Status:   full Family Communication: bedside Disposition Plan: admit   Total time spent 55 minutes.Greater than 50% of this time was spent in counseling, explanation of diagnosis, planning of further management, and coordination of care  Clydie Braun [facility] Pager [phone]  If 7PM-7AM, please contact night-coverage [email or web address] Password [email or web address] 04/09/2015, 1:30 AM

## 2015-04-10 ENCOUNTER — Observation Stay (HOSPITAL_BASED_OUTPATIENT_CLINIC_OR_DEPARTMENT_OTHER): Payer: BLUE CROSS/BLUE SHIELD

## 2015-04-10 DIAGNOSIS — Z789 Other specified health status: Secondary | ICD-10-CM | POA: Diagnosis not present

## 2015-04-10 DIAGNOSIS — R079 Chest pain, unspecified: Secondary | ICD-10-CM | POA: Insufficient documentation

## 2015-04-10 DIAGNOSIS — E785 Hyperlipidemia, unspecified: Secondary | ICD-10-CM | POA: Insufficient documentation

## 2015-04-10 DIAGNOSIS — Z7289 Other problems related to lifestyle: Secondary | ICD-10-CM | POA: Insufficient documentation

## 2015-04-10 DIAGNOSIS — Z720 Tobacco use: Secondary | ICD-10-CM | POA: Diagnosis not present

## 2015-04-10 DIAGNOSIS — R55 Syncope and collapse: Secondary | ICD-10-CM | POA: Insufficient documentation

## 2015-04-10 DIAGNOSIS — F1090 Alcohol use, unspecified, uncomplicated: Secondary | ICD-10-CM | POA: Insufficient documentation

## 2015-04-10 LAB — BASIC METABOLIC PANEL
Anion gap: 8 (ref 5–15)
BUN: 12 mg/dL (ref 6–20)
CHLORIDE: 109 mmol/L (ref 101–111)
CO2: 23 mmol/L (ref 22–32)
Calcium: 9.1 mg/dL (ref 8.9–10.3)
Creatinine, Ser: 0.93 mg/dL (ref 0.61–1.24)
GFR calc Af Amer: >60 mL/min (ref >60)
GFR calc non Af Amer: >60 mL/min (ref >60)
Glucose, Bld: 108 mg/dL — ABNORMAL HIGH (ref 65–99)
POTASSIUM: 3.7 mmol/L (ref 3.5–5.1)
SODIUM: 140 mmol/L (ref 135–145)

## 2015-04-10 LAB — ECHOCARDIOGRAM COMPLETE
HEIGHTINCHES: 73 in
Weight: 3489.6 oz

## 2015-04-10 MED ORDER — NICOTINE 14 MG/24HR TD PT24: 14.0000 mg | MEDICATED_PATCH | Freq: Every day | TRANSDERMAL | Status: DC

## 2015-04-10 MED ORDER — INFLUENZA VAC SPLIT QUAD 0.5 ML IM SUSY: 0.5000 mL | PREFILLED_SYRINGE | INTRAMUSCULAR | Status: DC

## 2015-04-10 NOTE — Discharge Instructions (Signed)
You were hospitalized for episode of Syncope or Passing Out. We think that this was most likely caused by Vagovagal Syncope - most common cause of this, can happen suddenly like the description of your history. We did testing and heart monitoring overnight, that did not show any heart abnormality. No evidence of heart attack or abnormal rhythm. We do recommend quitting smoking and cutting back on alcohol consumption to promote healthy lifestyle moving forward to prevent future heart disease. You can use the nicotine patches one patch once a day leave on for 24 hours, over next 2 weeks, then set a quit date.  Please schedule a hospital follow-up appointment or walk-in, whichever available [facility] You were last seen by Dr Elvina SidleKurt Lauenstein in 2016, prior to that Dr Warner MccreedyJessica Copeland in 2015.  We completed the ECHOcardiogram test, but will not get the official Cardiologist reading report until later today or tomorrow, but we do not think that this would keep you in the hospital. Make sure that you discuss results with your doctor at hospital follow-up appointment.  If symptoms return with chest pain, shortness of breath, another syncopal episode or other new concerning symptoms may return to ED for re-evaluation otherwise, can follow-up as planned.

## 2015-04-10 NOTE — Progress Notes (Signed)
Patient discharge to home accompanied by NT and wife via wheelchair. Discharge instructons given. Patent verbalizes understanding. Telemetry box and IV removed prior to discharge and site in good condition.

## 2016-05-05 DIAGNOSIS — L255 Unspecified contact dermatitis due to plants, except food: Secondary | ICD-10-CM | POA: Diagnosis not present

## 2017-05-24 ENCOUNTER — Ambulatory Visit: Payer: BLUE CROSS/BLUE SHIELD | Admitting: Emergency Medicine

## 2017-05-24 ENCOUNTER — Encounter: Payer: Self-pay | Admitting: Emergency Medicine

## 2017-05-24 ENCOUNTER — Ambulatory Visit (INDEPENDENT_AMBULATORY_CARE_PROVIDER_SITE_OTHER): Payer: BLUE CROSS/BLUE SHIELD | Admitting: Emergency Medicine

## 2017-05-24 ENCOUNTER — Other Ambulatory Visit: Payer: Self-pay

## 2017-05-24 VITALS — BP 110/64 | HR 73 | Temp 98.1°F | Resp 16 | Ht 72.0 in | Wt 223.8 lb

## 2017-05-24 DIAGNOSIS — F329 Major depressive disorder, single episode, unspecified: Secondary | ICD-10-CM | POA: Diagnosis not present

## 2017-05-24 DIAGNOSIS — Z0001 Encounter for general adult medical examination with abnormal findings: Secondary | ICD-10-CM | POA: Diagnosis not present

## 2017-05-24 DIAGNOSIS — E781 Pure hyperglyceridemia: Secondary | ICD-10-CM | POA: Diagnosis not present

## 2017-05-24 DIAGNOSIS — F32A Depression, unspecified: Secondary | ICD-10-CM

## 2017-05-24 MED ORDER — FLUOXETINE HCL 20 MG PO TABS: 20.0000 mg | ORAL_TABLET | Freq: Every day | ORAL | 3 refills | Status: DC

## 2017-05-24 NOTE — Progress Notes (Addendum)
Shawn Logan 37 y.o.   Chief Complaint  Patient presents with  . Annual Exam    HISTORY OF PRESENT ILLNESS: This is a 37 y.o. male here for his annual exam.  No significant past medical history. Sister passed away 6 weeks ago and has been having a hard time dealing with it.  Complaining of depression: Depression screen [facility] 2/9 05/24/2017 06/18/2014  Decreased Interest 3 0  Down, Depressed, Hopeless 2 3  PHQ - 2 Score 5 3  Altered sleeping 3 3  Tired, decreased energy 3 3  Change in appetite 1 0  Feeling bad or failure about yourself  1 3  Trouble concentrating 3 3  Moving slowly or fidgety/restless 3 3  Suicidal thoughts 0 1  PHQ-9 Score 19 19  Difficult doing work/chores - Somewhat difficult     HPI   Prior to Admission medications   Medication Sig Start Date End Date Taking? Authorizing Provider  nicotine (NICODERM CQ - DOSED IN MG/24 HOURS) 14 mg/24hr patch Place 1 patch (14 mg total) onto the skin daily. 04/10/15   Smitty Cords, DO    No Known Allergies  Patient Active Problem List   Diagnosis Date Noted  . Pain in the chest   . Faintness   . Alcohol use   . Dyslipidemia   . Syncope 04/09/2015  . Tobacco abuse 04/09/2015  . Chest pain 04/09/2015  . Leukocytosis 04/09/2015    Past Medical History:  Diagnosis Date  . Anxiety   . Depression     No past surgical history on file.  Social History   Socioeconomic History  . Marital status: Single    Spouse name: Not on file  . Number of children: Not on file  . Years of education: Not on file  . Highest education level: Not on file  Occupational History  . Not on file  Social Needs  . Financial resource strain: Not on file  . Food insecurity:    Worry: Not on file    Inability: Not on file  . Transportation needs:    Medical: Not on file    Non-medical: Not on file  Tobacco Use  . Smoking status: Current Every Day Smoker    Packs/day: 1.50    Types: Cigarettes  . Smokeless tobacco:  Never Used  Substance and Sexual Activity  . Alcohol use: Yes    Alcohol/week: 7.2 oz    Types: 12 Cans of beer per week    Comment: weekend   . Drug use: No  . Sexual activity: Not on file  Lifestyle  . Physical activity:    Days per week: Not on file    Minutes per session: Not on file  . Stress: Not on file  Relationships  . Social connections:    Talks on phone: Not on file    Gets together: Not on file    Attends religious service: Not on file    Active member of club or organization: Not on file    Attends meetings of clubs or organizations: Not on file    Relationship status: Not on file  . Intimate partner violence:    Fear of current or ex partner: Not on file    Emotionally abused: Not on file    Physically abused: Not on file    Forced sexual activity: Not on file  Other Topics Concern  . Not on file  Social History Narrative  . Not on file  Family History  Problem Relation Age of Onset  . Alcohol abuse Father   . Drug abuse Sister        died 6 weeks ago  . Cancer Maternal Grandmother   . Cancer Maternal Grandfather        lung     Review of Systems  Constitutional: Negative.  Negative for chills and fever.  HENT: Negative.  Negative for sore throat.   Eyes: Negative.  Negative for blurred vision and double vision.  Respiratory: Negative.  Negative for cough and shortness of breath.   Cardiovascular: Negative.  Negative for chest pain and palpitations.  Gastrointestinal: Negative.  Negative for abdominal pain, heartburn, nausea and vomiting.  Genitourinary: Negative.  Negative for hematuria.  Musculoskeletal: Negative.  Negative for joint pain and myalgias.  Skin: Negative.  Negative for rash.  Neurological: Negative.  Negative for dizziness and headaches.  Endo/Heme/Allergies: Negative.   Psychiatric/Behavioral: Positive for depression.  All other systems reviewed and are negative.  Vitals:   05/24/17 1102  BP: 110/64  Pulse: 73  Resp: 16    Temp: 98.1 F (36.7 C)  SpO2: 98%     Physical Exam  Constitutional: He is oriented to person, place, and time. He appears well-developed and well-nourished.  HENT:  Head: Normocephalic and atraumatic.  Right Ear: External ear normal.  Left Ear: External ear normal.  Nose: Nose normal.  Mouth/Throat: Oropharynx is clear and moist.  Eyes: Pupils are equal, round, and reactive to light. Conjunctivae and EOM are normal.  Neck: Normal range of motion. Neck supple. No JVD present. No thyromegaly present.  Cardiovascular: Normal rate, regular rhythm, normal heart sounds and intact distal pulses.  Pulmonary/Chest: Effort normal and breath sounds normal.  Abdominal: Soft. Bowel sounds are normal. He exhibits no distension. There is no tenderness.  Musculoskeletal: Normal range of motion. He exhibits no edema or tenderness.  Lymphadenopathy:    He has no cervical adenopathy.  Neurological: He is alert and oriented to person, place, and time. No sensory deficit. He exhibits normal muscle tone. Coordination normal.  Skin: Skin is warm and dry. Capillary refill takes less than 2 seconds. No rash noted.  Psychiatric: His speech is normal and behavior is normal. Thought content normal. His affect is labile (Started to cry during the interview). Cognition and memory are normal. He exhibits a depressed mood.  Vitals reviewed.    ASSESSMENT & PLAN:  Shawn Logan was seen today for annual exam.  Diagnoses and all orders for this visit:  Encounter for general adult medical examination with abnormal findings -     CBC with Differential -     Comprehensive metabolic panel -     Hemoglobin A1c -     Lipid panel -     HIV antibody  Acute depression -     FLUoxetine (PROZAC) 20 MG tablet; Take 1 tablet (20 mg total) by mouth daily.  Hypertriglyceridemia     Patient Instructions       IF you received an x-ray today, you will receive an invoice from [facility]. Please contact  [facility] at [phone] with questions or concerns regarding your invoice.   IF you received labwork today, you will receive an invoice from [facility]. Please contact [facility] at [phone] with questions or concerns regarding your invoice.   Our billing staff will not be able to assist you with questions regarding bills from these companies.  You will be contacted with the lab results as soon as they are  available. The fastest way to get your results is to activate your My Chart account. Instructions are located on the last page of this paperwork. If you have not heard from [place] regarding the results in 2 weeks, please contact this office.      Health Maintenance, Male A healthy lifestyle and preventive care is important for your health and wellness. Ask your health care provider about what schedule of regular examinations is right for you. What should I know about weight and diet? Eat a Healthy Diet  Eat plenty of vegetables, fruits, whole grains, low-fat dairy products, and lean protein.  Do not eat a lot of foods high in solid fats, added sugars, or salt.  Maintain a Healthy Weight Regular exercise can help you achieve or maintain a healthy weight. You should:  Do at least 150 minutes of exercise each week. The exercise should increase your heart rate and make you sweat (moderate-intensity exercise).  Do strength-training exercises at least twice a week.  Watch Your Levels of Cholesterol and Blood Lipids  Have your blood tested for lipids and cholesterol every 5 years starting at 36 years of age. If you are at high risk for heart disease, you should start having your blood tested when you are 37 years old. You may need to have your cholesterol levels checked more often if: ? Your lipid or cholesterol levels are high. ? You are older than 37 years of age. ? You are at high risk for heart disease.  What should I know about cancer screening? Many types of cancers can  be detected early and may often be prevented. Lung Cancer  You should be screened every year for lung cancer if: ? You are a current smoker who has smoked for at least 30 years. ? You are a former smoker who has quit within the past 15 years.  Talk to your health care provider about your screening options, when you should start screening, and how often you should be screened.  Colorectal Cancer  Routine colorectal cancer screening usually begins at 37 years of age and should be repeated every 5-10 years until you are 37 years old. You may need to be screened more often if early forms of precancerous polyps or small growths are found. Your health care provider may recommend screening at an earlier age if you have risk factors for colon cancer.  Your health care provider may recommend using home test kits to check for hidden blood in the stool.  A small camera at the end of a tube can be used to examine your colon (sigmoidoscopy or colonoscopy). This checks for the earliest forms of colorectal cancer.  Prostate and Testicular Cancer  Depending on your age and overall health, your health care provider may do certain tests to screen for prostate and testicular cancer.  Talk to your health care provider about any symptoms or concerns you have about testicular or prostate cancer.  Skin Cancer  Check your skin from head to toe regularly.  Tell your health care provider about any new moles or changes in moles, especially if: ? There is a change in a mole's size, shape, or color. ? You have a mole that is larger than a pencil eraser.  Always use sunscreen. Apply sunscreen liberally and repeat throughout the day.  Protect yourself by wearing long sleeves, pants, a wide-brimmed hat, and sunglasses when outside.  What should I know about heart disease, diabetes, and high blood pressure?  If you are  36-5 years of age, have your blood pressure checked every 3-5 years. If you are 69 years of  age or older, have your blood pressure checked every year. You should have your blood pressure measured twice-once when you are at a hospital or clinic, and once when you are not at a hospital or clinic. Record the average of the two measurements. To check your blood pressure when you are not at a hospital or clinic, you can use: ? An automated blood pressure machine at a pharmacy. ? A home blood pressure monitor.  Talk to your health care provider about your target blood pressure.  If you are between 51-85 years old, ask your health care provider if you should take aspirin to prevent heart disease.  Have regular diabetes screenings by checking your fasting blood sugar level. ? If you are at a normal weight and have a low risk for diabetes, have this test once every three years after the age of 59. ? If you are overweight and have a high risk for diabetes, consider being tested at a younger age or more often.  A one-time screening for abdominal aortic aneurysm (AAA) by ultrasound is recommended for men aged 65-75 years who are current or former smokers. What should I know about preventing infection? Hepatitis B If you have a higher risk for hepatitis B, you should be screened for this virus. Talk with your health care provider to find out if you are at risk for hepatitis B infection. Hepatitis C Blood testing is recommended for:  Everyone born from [birth year] through 1965.  Anyone with known risk factors for hepatitis C.  Sexually Transmitted Diseases (STDs)  You should be screened each year for STDs including gonorrhea and chlamydia if: ? You are sexually active and are younger than 37 years of age. ? You are older than 37 years of age and your health care provider tells you that you are at risk for this type of infection. ? Your sexual activity has changed since you were last screened and you are at an increased risk for chlamydia or gonorrhea. Ask your health care provider if you are at  risk.  Talk with your health care provider about whether you are at high risk of being infected with HIV. Your health care provider may recommend a prescription medicine to help prevent HIV infection.  What else can I do?  Schedule regular health, dental, and eye exams.  Stay current with your vaccines (immunizations).  Do not use any tobacco products, such as cigarettes, chewing tobacco, and e-cigarettes. If you need help quitting, ask your health care provider.  Limit alcohol intake to no more than 2 drinks per day. One drink equals 12 ounces of beer, 5 ounces of wine, or 1 ounces of hard liquor.  Do not use street drugs.  Do not share needles.  Ask your health care provider for help if you need support or information about quitting drugs.  Tell your health care provider if you often feel depressed.  Tell your health care provider if you have ever been abused or do not feel safe at home. This information is not intended to replace advice given to you by your health care provider. Make sure you discuss any questions you have with your health care provider. Document Released: 07/07/2007 Document Revised: 09/07/2015 Document Reviewed: 10/12/2014 Elsevier Interactive Patient Education  2018 [employer].  Major Depressive Disorder, Adult Major depressive disorder (MDD) is a mental health condition. MDD often makes you  feel sad, hopeless, or helpless. MDD can also cause symptoms in your body. MDD can affect your:  Work.  School.  Relationships.  Other normal activities.  MDD can range from mild to very bad. It may occur once (single episode MDD). It can also occur many times (recurrent MDD). The main symptoms of MDD often include:  Feeling sad, depressed, or irritable most of the time.  Loss of interest.  MDD symptoms also include:  Sleeping too much or too little.  Eating too much or too little.  A change in your weight.  Feeling tired (fatigue) or having low  energy.  Feeling worthless.  Feeling guilty.  Trouble making decisions.  Trouble thinking clearly.  Thoughts of suicide or harming others.  Feeling weak.  Feeling agitated.  Keeping yourself from being around other people (isolation).  Follow these instructions at home: Activity  Do these things as told by your doctor: ? Go back to your normal activities. ? Exercise regularly. ? Spend time outdoors. Alcohol  Talk with your doctor about how alcohol can affect your antidepressant medicines.  Do not drink alcohol. Or, limit how much alcohol you drink. ? This means no more than 1 drink a day for nonpregnant women and 2 drinks a day for men. One drink equals one of these:  12 oz of beer.  5 oz of wine.  1 oz of hard liquor. General instructions  Take over-the-counter and prescription medicines only as told by your doctor.  Eat a healthy diet.  Get plenty of sleep.  Find activities that you enjoy. Make time to do them.  Think about joining a support group. Your doctor may be able to suggest a group for you.  Keep all follow-up visits as told by your doctor. This is important. Where to find more information:  [employer] on Mental Illness: ? www.nami.org  [place] [employer] of Mental Health: ? http://www.maynard.net/  National Suicide Prevention Lifeline: ? 332-172-9980. This is free, 24-hour help. Contact a doctor if:  Your symptoms get worse.  You have new symptoms. Get help right away if:  You self-harm.  You see, hear, taste, smell, or feel things that are not present (hallucinate). If you ever feel like you may hurt yourself or others, or have thoughts about taking your own life, get help right away. You can go to your nearest emergency department or call:  Your local emergency services (911 in the [place]  A suicide crisis helpline, such as the National Suicide Prevention Lifeline: ? 907-329-3181. This is open 24 hours a day.  This  information is not intended to replace advice given to you by your health care provider. Make sure you discuss any questions you have with your health care provider. Document Released: 12/20/2014 Document Revised: 09/25/2015 Document Reviewed: 09/25/2015 Elsevier Interactive Patient Education  2017 [employer] Inc.     Edwina Barth, MD [facility]

## 2017-05-24 NOTE — Patient Instructions (Addendum)
   IF you received an x-ray today, you will receive an invoice from [facility]. Please contact  Radiology at [phone] with questions or concerns regarding your invoice.   IF you received labwork today, you will receive an invoice from [facility]. Please contact [facility] at [phone] with questions or concerns regarding your invoice.   Our billing staff will not be able to assist you with questions regarding bills from these companies.  You will be contacted with the lab results as soon as they are available. The fastest way to get your results is to activate your My Chart account. Instructions are located on the last page of this paperwork. If you have not heard from us regarding the results in 2 weeks, please contact this office.      Health Maintenance, Male A healthy lifestyle and preventive care is important for your health and wellness. Ask your health care provider about what schedule of regular examinations is right for you. What should I know about weight and diet? Eat a Healthy Diet  Eat plenty of vegetables, fruits, whole grains, low-fat dairy products, and lean protein.  Do not eat a lot of foods high in solid fats, added sugars, or salt.  Maintain a Healthy Weight Regular exercise can help you achieve or maintain a healthy weight. You should:  Do at least 150 minutes of exercise each week. The exercise should increase your heart rate and make you sweat (moderate-intensity exercise).  Do strength-training exercises at least twice a week.  Watch Your Levels of Cholesterol and Blood Lipids  Have your blood tested for lipids and cholesterol every 5 years starting at 37 years of age. If you are at high risk for heart disease, you should start having your blood tested when you are 37 years old. You may need to have your cholesterol levels checked more often if: ? Your lipid or cholesterol levels are high. ? You are older than 37 years of age. ? You  are at high risk for heart disease.  What should I know about cancer screening? Many types of cancers can be detected early and may often be prevented. Lung Cancer  You should be screened every year for lung cancer if: ? You are a current smoker who has smoked for at least 30 years. ? You are a former smoker who has quit within the past 15 years.  Talk to your health care provider about your screening options, when you should start screening, and how often you should be screened.  Colorectal Cancer  Routine colorectal cancer screening usually begins at 37 years of age and should be repeated every 5-10 years until you are 37 years old. You may need to be screened more often if early forms of precancerous polyps or small growths are found. Your health care provider may recommend screening at an earlier age if you have risk factors for colon cancer.  Your health care provider may recommend using home test kits to check for hidden blood in the stool.  A small camera at the end of a tube can be used to examine your colon (sigmoidoscopy or colonoscopy). This checks for the earliest forms of colorectal cancer.  Prostate and Testicular Cancer  Depending on your age and overall health, your health care provider may do certain tests to screen for prostate and testicular cancer.  Talk to your health care provider about any symptoms or concerns you have about testicular or prostate cancer.  Skin Cancer  Check your skin   from head to toe regularly.  Tell your health care provider about any new moles or changes in moles, especially if: ? There is a change in a mole's size, shape, or color. ? You have a mole that is larger than a pencil eraser.  Always use sunscreen. Apply sunscreen liberally and repeat throughout the day.  Protect yourself by wearing long sleeves, pants, a wide-brimmed hat, and sunglasses when outside.  What should I know about heart disease, diabetes, and high blood  pressure?  If you are 18-39 years of age, have your blood pressure checked every 3-5 years. If you are 40 years of age or older, have your blood pressure checked every year. You should have your blood pressure measured twice-once when you are at a hospital or clinic, and once when you are not at a hospital or clinic. Record the average of the two measurements. To check your blood pressure when you are not at a hospital or clinic, you can use: ? An automated blood pressure machine at a pharmacy. ? A home blood pressure monitor.  Talk to your health care provider about your target blood pressure.  If you are between 45-79 years old, ask your health care provider if you should take aspirin to prevent heart disease.  Have regular diabetes screenings by checking your fasting blood sugar level. ? If you are at a normal weight and have a low risk for diabetes, have this test once every three years after the age of 45. ? If you are overweight and have a high risk for diabetes, consider being tested at a younger age or more often.  A one-time screening for abdominal aortic aneurysm (AAA) by ultrasound is recommended for men aged 65-75 years who are current or former smokers. What should I know about preventing infection? Hepatitis B If you have a higher risk for hepatitis B, you should be screened for this virus. Talk with your health care provider to find out if you are at risk for hepatitis B infection. Hepatitis C Blood testing is recommended for:  Everyone born from 1945 through 1965.  Anyone with known risk factors for hepatitis C.  Sexually Transmitted Diseases (STDs)  You should be screened each year for STDs including gonorrhea and chlamydia if: ? You are sexually active and are younger than 37 years of age. ? You are older than 37 years of age and your health care provider tells you that you are at risk for this type of infection. ? Your sexual activity has changed since you were last  screened and you are at an increased risk for chlamydia or gonorrhea. Ask your health care provider if you are at risk.  Talk with your health care provider about whether you are at high risk of being infected with HIV. Your health care provider may recommend a prescription medicine to help prevent HIV infection.  What else can I do?  Schedule regular health, dental, and eye exams.  Stay current with your vaccines (immunizations).  Do not use any tobacco products, such as cigarettes, chewing tobacco, and e-cigarettes. If you need help quitting, ask your health care provider.  Limit alcohol intake to no more than 2 drinks per day. One drink equals 12 ounces of beer, 5 ounces of wine, or 1 ounces of hard liquor.  Do not use street drugs.  Do not share needles.  Ask your health care provider for help if you need support or information about quitting drugs.  Tell your health care   provider if you often feel depressed.  Tell your health care provider if you have ever been abused or do not feel safe at home. This information is not intended to replace advice given to you by your health care provider. Make sure you discuss any questions you have with your health care provider. Document Released: 07/07/2007 Document Revised: 09/07/2015 Document Reviewed: 10/12/2014 Elsevier Interactive Patient Education  2018 [employer].  Major Depressive Disorder, Adult Major depressive disorder (MDD) is a mental health condition. MDD often makes you feel sad, hopeless, or helpless. MDD can also cause symptoms in your body. MDD can affect your:  Work.  School.  Relationships.  Other normal activities.  MDD can range from mild to very bad. It may occur once (single episode MDD). It can also occur many times (recurrent MDD). The main symptoms of MDD often include:  Feeling sad, depressed, or irritable most of the time.  Loss of interest.  MDD symptoms also include:  Sleeping too much or too  little.  Eating too much or too little.  A change in your weight.  Feeling tired (fatigue) or having low energy.  Feeling worthless.  Feeling guilty.  Trouble making decisions.  Trouble thinking clearly.  Thoughts of suicide or harming others.  Feeling weak.  Feeling agitated.  Keeping yourself from being around other people (isolation).  Follow these instructions at home: Activity  Do these things as told by your doctor: ? Go back to your normal activities. ? Exercise regularly. ? Spend time outdoors. Alcohol  Talk with your doctor about how alcohol can affect your antidepressant medicines.  Do not drink alcohol. Or, limit how much alcohol you drink. ? This means no more than 1 drink a day for nonpregnant women and 2 drinks a day for men. One drink equals one of these:  12 oz of beer.  5 oz of wine.  1 oz of hard liquor. General instructions  Take over-the-counter and prescription medicines only as told by your doctor.  Eat a healthy diet.  Get plenty of sleep.  Find activities that you enjoy. Make time to do them.  Think about joining a support group. Your doctor may be able to suggest a group for you.  Keep all follow-up visits as told by your doctor. This is important. Where to find more information:  [employer] on Mental Illness: ? www.nami.org  [place] [employer] of Mental Health: ? http://www.maynard.net/  National Suicide Prevention Lifeline: ? 843-629-5408. This is free, 24-hour help. Contact a doctor if:  Your symptoms get worse.  You have new symptoms. Get help right away if:  You self-harm.  You see, hear, taste, smell, or feel things that are not present (hallucinate). If you ever feel like you may hurt yourself or others, or have thoughts about taking your own life, get help right away. You can go to your nearest emergency department or call:  Your local emergency services (911 in the [place]  A suicide crisis  helpline, such as the National Suicide Prevention Lifeline: ? 563 166 3185. This is open 24 hours a day.  This information is not intended to replace advice given to you by your health care provider. Make sure you discuss any questions you have with your health care provider. Document Released: 12/20/2014 Document Revised: 09/25/2015 Document Reviewed: 09/25/2015 Elsevier Interactive Patient Education  2017 [employer].

## 2017-05-25 LAB — LIPID PANEL
Chol/HDL Ratio: 5 ratio (ref 0.0–5.0)
Cholesterol, Total: 189 mg/dL (ref 100–199)
HDL: 38 mg/dL — AB (ref >39)
LDL CALC: 106 mg/dL — AB (ref 0–99)
TRIGLYCERIDES: 226 mg/dL — AB (ref 0–149)
VLDL Cholesterol Cal: 45 mg/dL — ABNORMAL HIGH (ref 5–40)

## 2017-05-25 LAB — COMPREHENSIVE METABOLIC PANEL
ALBUMIN: 4.4 g/dL (ref 3.5–5.5)
ALK PHOS: 104 IU/L (ref 39–117)
ALT: 22 IU/L (ref 0–44)
AST: 18 IU/L (ref 0–40)
Albumin/Globulin Ratio: 1.7 (ref 1.2–2.2)
BILIRUBIN TOTAL: 0.2 mg/dL (ref 0.0–1.2)
BUN/Creatinine Ratio: 13 (ref 9–20)
BUN: 12 mg/dL (ref 6–20)
CO2: 19 mmol/L — ABNORMAL LOW (ref 20–29)
CREATININE: 0.89 mg/dL (ref 0.76–1.27)
Calcium: 9.2 mg/dL (ref 8.7–10.2)
Chloride: 105 mmol/L (ref 96–106)
GFR calc non Af Amer: 110 mL/min/{1.73_m2} (ref >59)
GFR, EST AFRICAN AMERICAN: 127 mL/min/{1.73_m2} (ref >59)
GLOBULIN, TOTAL: 2.6 g/dL (ref 1.5–4.5)
Glucose: 87 mg/dL (ref 65–99)
Potassium: 3.9 mmol/L (ref 3.5–5.2)
SODIUM: 139 mmol/L (ref 134–144)
TOTAL PROTEIN: 7 g/dL (ref 6.0–8.5)

## 2017-05-25 LAB — CBC WITH DIFFERENTIAL/PLATELET
Basophils Absolute: 0.1 10*3/uL (ref 0.0–0.2)
Basos: 1 %
EOS (ABSOLUTE): 0.5 10*3/uL — ABNORMAL HIGH (ref 0.0–0.4)
EOS: 5 %
HEMATOCRIT: 52.5 % — AB (ref 37.5–51.0)
HEMOGLOBIN: 17.4 g/dL (ref 13.0–17.7)
IMMATURE GRANS (ABS): 0 10*3/uL (ref 0.0–0.1)
Immature Granulocytes: 0 %
LYMPHS ABS: 3.6 10*3/uL — AB (ref 0.7–3.1)
Lymphs: 33 %
MCH: 28.2 pg (ref 26.6–33.0)
MCHC: 33.1 g/dL (ref 31.5–35.7)
MCV: 85 fL (ref 79–97)
MONOCYTES: 10 %
Monocytes Absolute: 1.1 10*3/uL — ABNORMAL HIGH (ref 0.1–0.9)
NEUTROS ABS: 5.6 10*3/uL (ref 1.4–7.0)
Neutrophils: 51 %
Platelets: 277 10*3/uL (ref 150–379)
RBC: 6.16 x10E6/uL — ABNORMAL HIGH (ref 4.14–5.80)
RDW: 14.1 % (ref 12.3–15.4)
WBC: 10.9 10*3/uL — ABNORMAL HIGH (ref 3.4–10.8)

## 2017-05-25 LAB — HEMOGLOBIN A1C
Est. average glucose Bld gHb Est-mCnc: 108 mg/dL
HEMOGLOBIN A1C: 5.4 % (ref 4.8–5.6)

## 2017-05-25 LAB — HIV ANTIBODY (ROUTINE TESTING W REFLEX): HIV Screen 4th Generation wRfx: NONREACTIVE

## 2017-05-28 ENCOUNTER — Encounter: Payer: Self-pay | Admitting: *Deleted

## 2017-06-03 ENCOUNTER — Encounter: Payer: Self-pay | Admitting: Family Medicine

## 2017-06-03 ENCOUNTER — Other Ambulatory Visit: Payer: Self-pay

## 2017-06-03 ENCOUNTER — Ambulatory Visit: Payer: BLUE CROSS/BLUE SHIELD | Admitting: Family Medicine

## 2017-06-03 VITALS — BP 118/70 | HR 73 | Temp 98.5°F | Resp 16 | Ht 72.0 in | Wt 223.0 lb

## 2017-06-03 DIAGNOSIS — R6889 Other general symptoms and signs: Secondary | ICD-10-CM

## 2017-06-03 DIAGNOSIS — J069 Acute upper respiratory infection, unspecified: Secondary | ICD-10-CM

## 2017-06-03 LAB — POCT RAPID STREP A (OFFICE): RAPID STREP A SCREEN: NEGATIVE

## 2017-06-03 LAB — POCT INFLUENZA A/B
INFLUENZA A, POC: NEGATIVE
INFLUENZA B, POC: NEGATIVE

## 2017-06-03 NOTE — Patient Instructions (Addendum)
  DAYQUIL EVERY SIX HOURS AND NYQUIL AT NIGHTTIME FOR SYMPTOMS.   YOU CAN ALSO TAKE IBUPROFEN FOR FEVER EVERY SIX HOURS.    IF you received an x-ray today, you will receive an invoice from [facility]. Please contact [facility] at [phone] with questions or concerns regarding your invoice.   IF you received labwork today, you will receive an invoice from [facility]. Please contact [facility] at [phone] with questions or concerns regarding your invoice.   Our billing staff will not be able to assist you with questions regarding bills from these companies.  You will be contacted with the lab results as soon as they are available. The fastest way to get your results is to activate your My Chart account. Instructions are located on the last page of this paperwork. If you have not heard from [place] regarding the results in 2 weeks, please contact this office.      Upper Respiratory Infection, Adult Most upper respiratory infections (URIs) are caused by a virus. A URI affects the nose, throat, and upper air passages. The most common type of URI is often called "the common cold." Follow these instructions at home:  Take medicines only as told by your doctor.  Gargle warm saltwater or take cough drops to comfort your throat as told by your doctor.  Use a warm mist humidifier or inhale steam from a shower to increase air moisture. This may make it easier to breathe.  Drink enough fluid to keep your pee (urine) clear or pale yellow.  Eat soups and other clear broths.  Have a healthy diet.  Rest as needed.  Go back to work when your fever is gone or your doctor says it is okay. ? You may need to stay home longer to avoid giving your URI to others. ? You can also wear a face mask and wash your hands often to prevent spread of the virus.  Use your inhaler more if you have asthma.  Do not use any tobacco products, including cigarettes, chewing tobacco, or electronic  cigarettes. If you need help quitting, ask your doctor. Contact a doctor if:  You are getting worse, not better.  Your symptoms are not helped by medicine.  You have chills.  You are getting more short of breath.  You have brown or [place] mucus.  You have yellow or brown discharge from your nose.  You have pain in your face, especially when you bend forward.  You have a fever.  You have puffy (swollen) neck glands.  You have pain while swallowing.  You have white areas in the back of your throat. Get help right away if:  You have very bad or constant: ? Headache. ? Ear pain. ? Pain in your forehead, behind your eyes, and over your cheekbones (sinus pain). ? Chest pain.  You have long-lasting (chronic) lung disease and any of the following: ? Wheezing. ? Long-lasting cough. ? Coughing up blood. ? A change in your usual mucus.  You have a stiff neck.  You have changes in your: ? Vision. ? Hearing. ? Thinking. ? Mood. This information is not intended to replace advice given to you by your health care provider. Make sure you discuss any questions you have with your health care provider. Document Released: 06/27/2007 Document Revised: 09/11/2015 Document Reviewed: 04/15/2013 Elsevier Interactive Patient Education  2018 [employer].

## 2017-06-03 NOTE — Progress Notes (Signed)
Subjective:    Patient ID: Shawn Logan, male    DOB: 01/16/1981, 37 y.o.   MRN: [id number]  06/03/2017  Fatigue (with body pain and chills ) and Nasal Congestion (x 3 days )    HPI This 37 y.o. male presents for evaluation of acute illness onset three days ago. Fever Tmax 101 this morning; first day of fever.  +chills/sweats.  +body aches. No headache.  +ST initial symptom; no pain with swallowing; much improved. No ear pain.  +rhinorrhea; +nasal congestion. +cough mild.  No SOB.  Mild sputum production.  No wheezing. +tobacco. No n/v/d. Son is sick one year old.  No daycare; stays with patient's mother yet exposed to older cousins. Georganna Skeans. No recent tick bites. Tylenol for fever; last dose 7:00am.   BP Readings from Last 3 Encounters:  06/03/17 118/70  05/24/17 110/64  04/10/15 114/68   Wt Readings from Last 3 Encounters:  06/03/17 223 lb (101.2 kg)  05/24/17 223 lb 12.8 oz (101.5 kg)  04/10/15 218 lb 1.6 oz (98.9 kg)   There is no immunization history for the selected administration types on file for this patient.  Review of Systems  Constitutional: Positive for chills and fever. Negative for activity change, appetite change, diaphoresis and fatigue.  HENT: Positive for congestion, rhinorrhea and sore throat. Negative for dental problem, drooling, ear discharge, ear pain, sinus pressure, sinus pain, sneezing, trouble swallowing and voice change.   Respiratory: Positive for cough. Negative for shortness of breath and wheezing.   Cardiovascular: Negative for chest pain, palpitations and leg swelling.  Gastrointestinal: Negative for abdominal pain, diarrhea, nausea and vomiting.  Endocrine: Negative for cold intolerance, heat intolerance, polydipsia, polyphagia and polyuria.  Skin: Negative for color change, rash and wound.  Neurological: Negative for dizziness, tremors, seizures, syncope, facial asymmetry, speech difficulty, weakness, light-headedness, numbness and  headaches.  Psychiatric/Behavioral: Negative for dysphoric mood and sleep disturbance. The patient is not nervous/anxious.     Past Medical History:  Diagnosis Date  . Anxiety   . Depression    History reviewed. No pertinent surgical history. No Known Allergies Current Outpatient Medications on File Prior to Visit  Medication Sig Dispense Refill  . FLUoxetine (PROZAC) 20 MG tablet Take 1 tablet (20 mg total) by mouth daily. 30 tablet 3   No current facility-administered medications on file prior to visit.    Social History   Socioeconomic History  . Marital status: Single    Spouse name: Not on file  . Number of children: Not on file  . Years of education: Not on file  . Highest education level: Not on file  Occupational History  . Not on file  Social Needs  . Financial resource strain: Not on file  . Food insecurity:    Worry: Not on file    Inability: Not on file  . Transportation needs:    Medical: Not on file    Non-medical: Not on file  Tobacco Use  . Smoking status: Current Every Day Smoker    Packs/day: 1.50    Types: Cigarettes  . Smokeless tobacco: Never Used  Substance and Sexual Activity  . Alcohol use: Yes    Alcohol/week: 7.2 oz    Types: 12 Cans of beer per week    Comment: weekend   . Drug use: No  . Sexual activity: Not on file  Lifestyle  . Physical activity:    Days per week: Not on file    Minutes per session: Not on  file  . Stress: Not on file  Relationships  . Social connections:    Talks on phone: Not on file    Gets together: Not on file    Attends religious service: Not on file    Active member of club or organization: Not on file    Attends meetings of clubs or organizations: Not on file    Relationship status: Not on file  . Intimate partner violence:    Fear of current or ex partner: Not on file    Emotionally abused: Not on file    Physically abused: Not on file    Forced sexual activity: Not on file  Other Topics Concern    . Not on file  Social History Narrative  . Not on file   Family History  Problem Relation Age of Onset  . Alcohol abuse Father   . Drug abuse Sister        died 6 weeks ago  . Cancer Maternal Grandmother   . Cancer Maternal Grandfather        lung       Objective:    BP 118/70   Pulse 73   Temp 98.5 F (36.9 C) (Oral)   Resp 16   Ht 6' (1.829 m)   Wt 223 lb (101.2 kg)   SpO2 97%   BMI 30.24 kg/m  Physical Exam  Constitutional: He is oriented to person, place, and time. He appears well-developed and well-nourished. No distress.  HENT:  Head: Normocephalic and atraumatic.  Right Ear: External ear normal.  Nose: Nose normal.  Mouth/Throat: Uvula is midline and mucous membranes are normal. Posterior oropharyngeal erythema present. No oropharyngeal exudate, posterior oropharyngeal edema or tonsillar abscesses. Tonsils are 1+ on the right. Tonsils are 1+ on the left. No tonsillar exudate.  Eyes: Pupils are equal, round, and reactive to light. Conjunctivae and EOM are normal.  Neck: Normal range of motion. Neck supple. Carotid bruit is not present. No thyromegaly present.  Cardiovascular: Normal rate, regular rhythm, normal heart sounds and intact distal pulses. Exam reveals no gallop and no friction rub.  No murmur heard. Pulmonary/Chest: Effort normal and breath sounds normal. No stridor. No respiratory distress. He has no wheezes. He has no rales.  Lymphadenopathy:    He has cervical adenopathy.  Neurological: He is alert and oriented to person, place, and time. No cranial nerve deficit.  Skin: Skin is warm and dry. No rash noted. He is not diaphoretic.  Psychiatric: He has a normal mood and affect. His behavior is normal.  Nursing note and vitals reviewed.  No results found. Depression screen [facility] 2/9 06/03/2017 05/24/2017 06/18/2014  Decreased Interest 0 3 0  Down, Depressed, Hopeless 0 2 3  PHQ - 2 Score 0 5 3  Altered sleeping - 3 3  Tired, decreased energy - 3 3   Change in appetite - 1 0  Feeling bad or failure about yourself  - 1 3  Trouble concentrating - 3 3  Moving slowly or fidgety/restless - 3 3  Suicidal thoughts - 0 1  PHQ-9 Score - 19 19  Difficult doing work/chores - - Somewhat difficult   Fall Risk  06/03/2017 05/24/2017  Falls in the past year? No No    Results for orders placed or performed in visit on 06/03/17  POCT Influenza A/B  Result Value Ref Range   Influenza A, POC Negative Negative   Influenza B, POC Negative Negative  POCT rapid strep A  Result Value  Ref Range   Rapid Strep A Screen Negative Negative       Assessment & Plan:   1. Flu-like symptoms   2. Viral URI     New onset; send throat culture.  Consistent with ivral upper respiratory infection. Supportive care with rest, fluids, Ibuprofen; also recommend taking Dayquil and Nyquil which pt has at home. No work while febrile.  RTC for acute worsening.   Orders Placed This Encounter  Procedures  . Culture, Group A Strep    Order Specific Question:   Source    Answer:   oropharynx  . POCT Influenza A/B  . POCT rapid strep A   No orders of the defined types were placed in this encounter.   No follow-ups on file.   Laurann Mcmorris Paulita Fujita, M.D. Primary Care at [facility] previously [facility] [address] [place], [place]  [address] [phone] phone [phone] fax

## 2017-06-05 LAB — CULTURE, GROUP A STREP: Strep A Culture: NEGATIVE

## 2017-06-06 DIAGNOSIS — F4323 Adjustment disorder with mixed anxiety and depressed mood: Secondary | ICD-10-CM | POA: Diagnosis not present

## 2017-06-07 ENCOUNTER — Encounter: Payer: Self-pay | Admitting: Family Medicine

## 2017-06-12 ENCOUNTER — Encounter: Payer: Self-pay | Admitting: Family Medicine

## 2017-06-24 DIAGNOSIS — F4323 Adjustment disorder with mixed anxiety and depressed mood: Secondary | ICD-10-CM | POA: Diagnosis not present

## 2017-07-10 DIAGNOSIS — F4323 Adjustment disorder with mixed anxiety and depressed mood: Secondary | ICD-10-CM | POA: Diagnosis not present

## 2017-07-12 IMAGING — CR DG CHEST 1V PORT
1 series · 1 of 1 positions shown · non-contrast
Comparison: 01/16/2005

CLINICAL DATA: Chest pain

EXAM:
PORTABLE CHEST 1 VIEW

[AP]
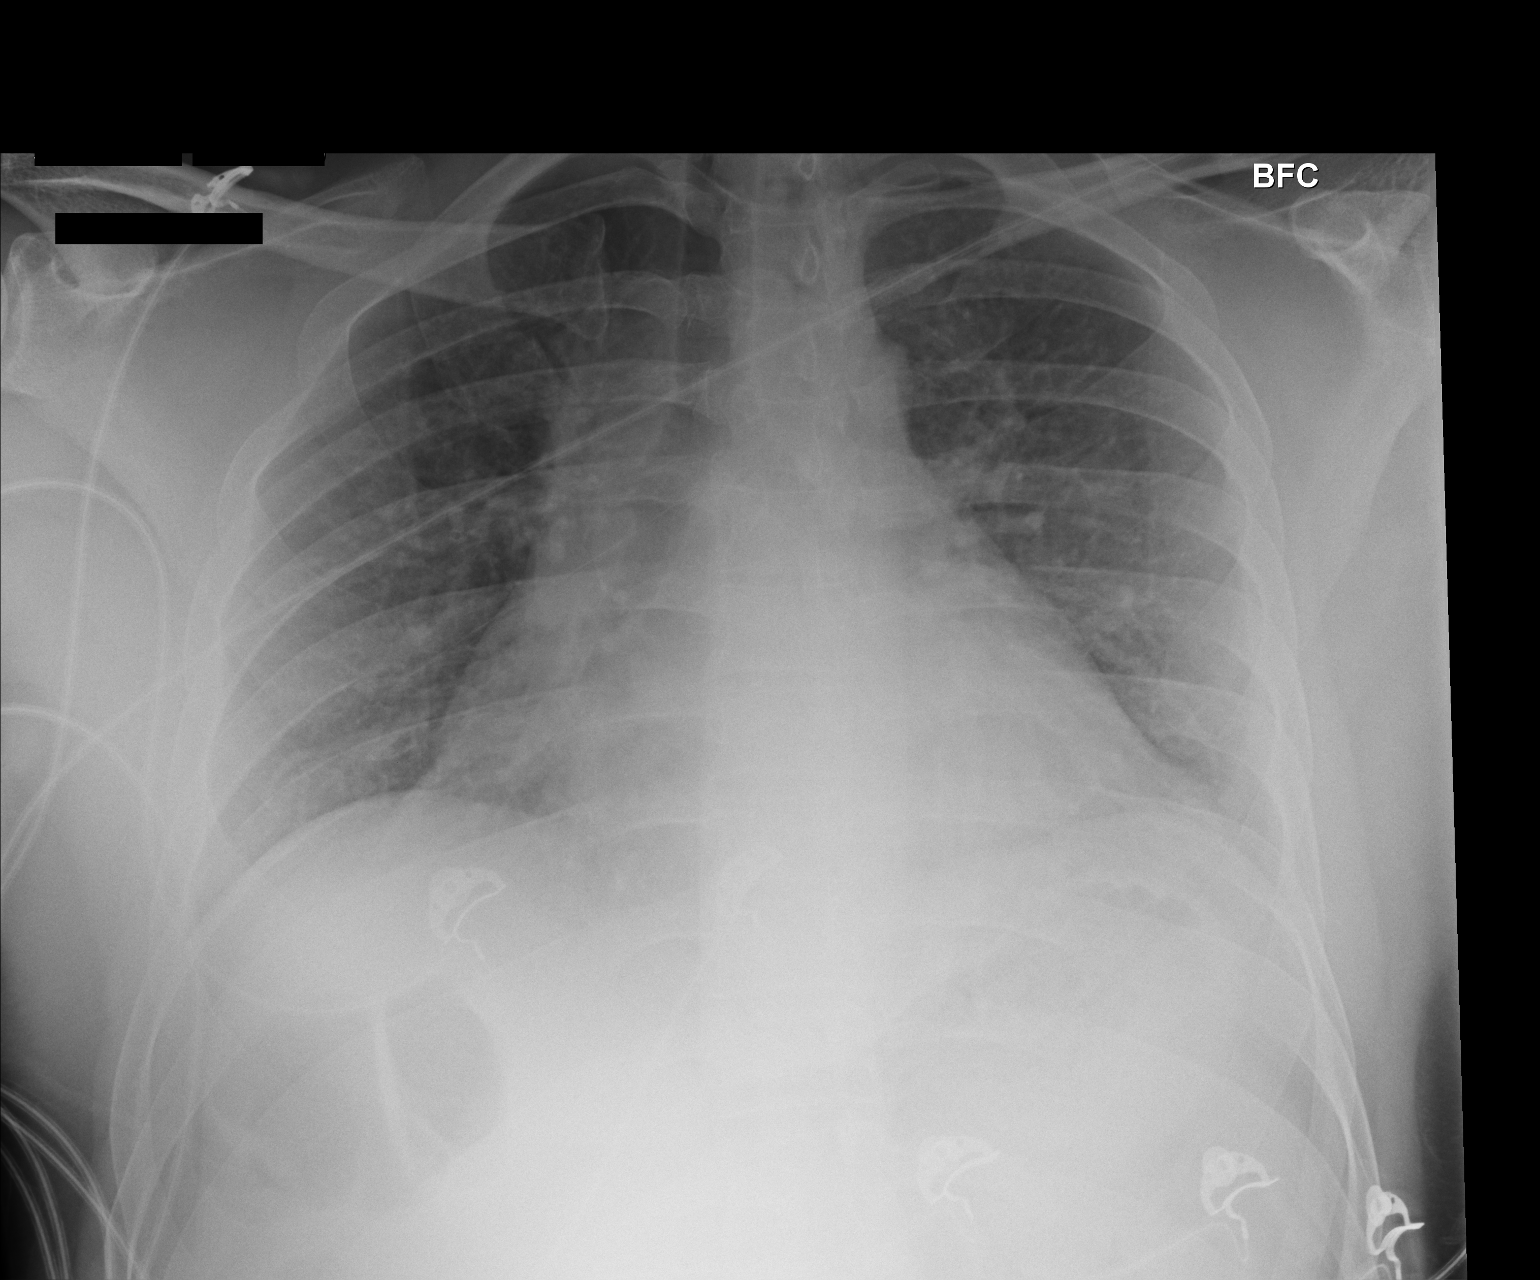

[1 of 1 positions shown; findings below may reference images not displayed]

FINDINGS: Cardiac shadow is enlarged in part by the portable technique. The
lungs are well aerated with crowding of the vascular markings due to
poor inspiratory effort. No focal infiltrate is seen. No acute bony
abnormality is noted.
IMPRESSION: No active disease.

## 2017-09-10 ENCOUNTER — Other Ambulatory Visit: Payer: Self-pay

## 2017-09-10 ENCOUNTER — Encounter: Payer: Self-pay | Admitting: Physician Assistant

## 2017-09-10 ENCOUNTER — Ambulatory Visit: Payer: BLUE CROSS/BLUE SHIELD | Admitting: Physician Assistant

## 2017-09-10 VITALS — BP 110/80 | HR 65 | Temp 99.0°F | Resp 18 | Ht 73.62 in | Wt 218.2 lb

## 2017-09-10 DIAGNOSIS — J069 Acute upper respiratory infection, unspecified: Secondary | ICD-10-CM | POA: Diagnosis not present

## 2017-09-10 DIAGNOSIS — J029 Acute pharyngitis, unspecified: Secondary | ICD-10-CM

## 2017-09-10 DIAGNOSIS — R059 Cough, unspecified: Secondary | ICD-10-CM

## 2017-09-10 DIAGNOSIS — Z720 Tobacco use: Secondary | ICD-10-CM

## 2017-09-10 DIAGNOSIS — R05 Cough: Secondary | ICD-10-CM | POA: Diagnosis not present

## 2017-09-10 LAB — POCT RAPID STREP A (OFFICE): Rapid Strep A Screen: NEGATIVE

## 2017-09-10 MED ORDER — BENZONATATE 100 MG PO CAPS: 100.0000 mg | ORAL_CAPSULE | Freq: Three times a day (TID) | ORAL | 0 refills | Status: DC | PRN

## 2017-09-10 NOTE — Patient Instructions (Addendum)
- We will treat this as a respiratory viral infection.  - I recommend you rest, drink plenty of fluids, eat light meals including soups.  - You may use cough syrup at night for your cough and sore throat, Tessalon pearls during the day.  - You may also use Tylenol or ibuprofen over-the-counter for your sore throat. Tea recipe for sore throat: boil water, add 2 inches shaved ginger root, steep 15 minutes, add juice from 2 full lemons, and 2 tbsp honey. - Please let me know if you are not seeing any improvement or get worse in 5-7 days.     Information for smoking cessation as below.  Please return when you are ready to quit smoking.  If you have lab work done today you will be contacted with your lab results within the next 2 weeks.  If you have not heard from us then please contact us. The fastest way to get your results is to register for My Chart.   IF you received an x-ray today, you will receive an invoice from [facility]. Please contact [facility] at [phone] with questions or concerns regarding your invoice.   IF you received labwork today, you will receive an invoice from [facility]. Please contact [facility] at [phone] with questions or concerns regarding your invoice.   Our billing staff will not be able to assist you with questions regarding bills from these companies.  You will be contacted with the lab results as soon as they are available. The fastest way to get your results is to activate your My Chart account. Instructions are located on the last page of this paperwork. If you have not heard from us regarding the results in 2 weeks, please contact this office.      Steps to Quit Smoking Smoking tobacco can be bad for your health. It can also affect almost every organ in your body. Smoking puts you and people around you at risk for many serious long-lasting (chronic) diseases. Quitting smoking is hard, but it is one of the best things that you can do  for your health. It is never too late to quit. What are the benefits of quitting smoking? When you quit smoking, you lower your risk for getting serious diseases and conditions. They can include:  Lung cancer or lung disease.  Heart disease.  Stroke.  Heart attack.  Not being able to have children (infertility).  Weak bones (osteoporosis) and broken bones (fractures).  If you have coughing, wheezing, and shortness of breath, those symptoms may get better when you quit. You may also get sick less often. If you are pregnant, quitting smoking can help to lower your chances of having a baby of low birth weight. What can I do to help me quit smoking? Talk with your doctor about what can help you quit smoking. Some things you can do (strategies) include:  Quitting smoking totally, instead of slowly cutting back how much you smoke over a period of time.  Going to in-person counseling. You are more likely to quit if you go to many counseling sessions.  Using resources and support systems, such as: ? Agricultural engineernline chats with a Veterinary surgeoncounselor. ? Phone quitlines. ? Automotive engineerrinted self-help materials. ? Support groups or group counseling. ? Text messaging programs. ? Mobile phone apps or applications.  Taking medicines. Some of these medicines may have nicotine in them. If you are pregnant or breastfeeding, do not take any medicines to quit smoking unless your doctor says it is okay.  Talk with your doctor about counseling or other things that can help you.  Talk with your doctor about using more than one strategy at the same time, such as taking medicines while you are also going to in-person counseling. This can help make quitting easier. What things can I do to make it easier to quit? Quitting smoking might feel very hard at first, but there is a lot that you can do to make it easier. Take these steps:  Talk to your family and friends. Ask them to support and encourage you.  Call phone quitlines, reach  out to support groups, or work with a Veterinary surgeoncounselor.  Ask people who smoke to not smoke around you.  Avoid places that make you want (trigger) to smoke, such as: ? Bars. ? Parties. ? Smoke-break areas at work.  Spend time with people who do not smoke.  Lower the stress in your life. Stress can make you want to smoke. Try these things to help your stress: ? Getting regular exercise. ? Deep-breathing exercises. ? Yoga. ? Meditating. ? Doing a body scan. To do this, close your eyes, focus on one area of your body at a time from head to toe, and notice which parts of your body are tense. Try to relax the muscles in those areas.  Download or buy apps on your mobile phone or tablet that can help you stick to your quit plan. There are many free apps, such as QuitGuide from the [employer] developer(Centers for Disease Control and Prevention). You can find more support from smokefree.gov and other websites.  This information is not intended to replace advice given to you by your health care provider. Make sure you discuss any questions you have with your health care provider. Document Released: 11/04/2008 Document Revised: 09/06/2015 Document Reviewed: 05/25/2014 Elsevier Interactive Patient Education  2018 [employer]   Bupropion tablets (Depression/Mood Disorders) What is this medicine? BUPROPION (byoo PROE pee on) is used to treat depression. This medicine may be used for other purposes; ask your health care provider or pharmacist if you have questions. COMMON BRAND NAME(S): Wellbutrin What should I tell my health care provider before I take this medicine? They need to know if you have any of these conditions: -an eating disorder, such as anorexia or bulimia -bipolar disorder or psychosis -diabetes or high blood sugar, treated with medication -glaucoma -heart disease, previous heart attack, or irregular heart beat -head injury or brain tumor -high blood pressure -kidney or liver  disease -seizures -suicidal thoughts or a previous suicide attempt -Tourette's syndrome -weight loss -an unusual or allergic reaction to bupropion, other medicines, foods, dyes, or preservatives -breast-feeding -pregnant or trying to become pregnant How should I use this medicine? Take this medicine by mouth with a glass of water. Follow the directions on the prescription label. You can take it with or without food. If it upsets your stomach, take it with food. Take your medicine at regular intervals. Do not take your medicine more often than directed. Do not stop taking this medicine suddenly except upon the advice of your doctor. Stopping this medicine too quickly may cause serious side effects or your condition may worsen. A special MedGuide will be given to you by the pharmacist with each prescription and refill. Be sure to read this information carefully each time. Talk to your pediatrician regarding the use of this medicine in children. Special care may be needed. Overdosage: If you think you have taken too much of this medicine contact a  poison control center or emergency room at once. NOTE: This medicine is only for you. Do not share this medicine with others. What if I miss a dose? If you miss a dose, take it as soon as you can. If it is less than four hours to your next dose, take only that dose and skip the missed dose. Do not take double or extra doses. What may interact with this medicine? Do not take this medicine with any of the following medications: -linezolid -MAOIs like Azilect, Carbex, Eldepryl, Marplan, Nardil, and Parnate -methylene blue (injected into a vein) -other medicines that contain bupropion like Zyban This medicine may also interact with the following medications: -alcohol -certain medicines for anxiety or sleep -certain medicines for blood pressure like metoprolol, propranolol -certain medicines for depression or psychotic disturbances -certain medicines for  HIV or AIDS like efavirenz, lopinavir, nelfinavir, ritonavir -certain medicines for irregular heart beat like propafenone, flecainide -certain medicines for Parkinson's disease like amantadine, levodopa -certain medicines for seizures like carbamazepine, phenytoin, phenobarbital -cimetidine -clopidogrel -cyclophosphamide -digoxin -furazolidone -isoniazid -nicotine -orphenadrine -procarbazine -steroid medicines like prednisone or cortisone -stimulant medicines for attention disorders, weight loss, or to stay awake -tamoxifen -theophylline -thiotepa -ticlopidine -tramadol -warfarin This list may not describe all possible interactions. Give your health care provider a list of all the medicines, herbs, non-prescription drugs, or dietary supplements you use. Also tell them if you smoke, drink alcohol, or use illegal drugs. Some items may interact with your medicine. What should I watch for while using this medicine? Tell your doctor if your symptoms do not get better or if they get worse. Visit your doctor or health care professional for regular checks on your progress. Because it may take several weeks to see the full effects of this medicine, it is important to continue your treatment as prescribed by your doctor. Patients and their families should watch out for new or worsening thoughts of suicide or depression. Also watch out for sudden changes in feelings such as feeling anxious, agitated, panicky, irritable, hostile, aggressive, impulsive, severely restless, overly excited and hyperactive, or not being able to sleep. If this happens, especially at the beginning of treatment or after a change in dose, call your health care professional. Avoid alcoholic drinks while taking this medicine. Drinking excessive alcoholic beverages, using sleeping or anxiety medicines, or quickly stopping the use of these agents while taking this medicine may increase your risk for a seizure. Do not drive or use  heavy machinery until you know how this medicine affects you. This medicine can impair your ability to perform these tasks. Do not take this medicine close to bedtime. It may prevent you from sleeping. Your mouth may get dry. Chewing sugarless gum or sucking hard candy, and drinking plenty of water may help. Contact your doctor if the problem does not go away or is severe. What side effects may I notice from receiving this medicine? Side effects that you should report to your doctor or health care professional as soon as possible: -allergic reactions like skin rash, itching or hives, swelling of the face, lips, or tongue -breathing problems -changes in vision -confusion -elevated mood, decreased need for sleep, racing thoughts, impulsive behavior -fast or irregular heartbeat -hallucinations, loss of contact with reality -increased blood pressure -redness, blistering, peeling or loosening of the skin, including inside the mouth -seizures -suicidal thoughts or other mood changes -unusually weak or tired -vomiting Side effects that usually do not require medical attention (report to your doctor or health care  professional if they continue or are bothersome): -constipation -headache -loss of appetite -nausea -tremors -weight loss This list may not describe all possible side effects. Call your doctor for medical advice about side effects. You may report side effects to FDA at 1-800-FDA-1088. Where should I keep my medicine? Keep out of the reach of children. Store at room temperature between 20 and 25 degrees C (68 and 77 degrees F), away from direct sunlight and moisture. Keep tightly closed. Throw away any unused medicine after the expiration date. NOTE: This sheet is a summary. It may not cover all possible information. If you have questions about this medicine, talk to your doctor, pharmacist, or health care provider.  2018 Elsevier/Gold Standard (2015-07-01 13:44:21)

## 2017-09-10 NOTE — Progress Notes (Signed)
MRN: [id number] DOB: 1980/02/17  Subjective:   Shawn Logan is a 37 y.o. male presenting for chief complaint of Sore Throat (X 3 days) and Cough (X 3 days) .  Reports 3 day history of sore throat and mildly productive cough.  Feeling better today. Has tried tylenol with some relief. Denies fever, sinus headache, sinus congestion, rhinorrhea, ear pain, wheezing, shortness of breath, chest pain and myalgia, nausea, vomiting, abdominal pain and diarrhea. Has not had sick contact with anyone. No history of seasonal allergies or  asthma.  Smokes 1 ppd. Has thought about quitting in the past. Not quite ready. Denies any other aggravating or relieving factors, no other questions or concerns.  Rafael has a current medication list which includes the following prescription(s): fluoxetine. Also has No Known Allergies.  Howie  has a past medical history of Anxiety and Depression. Also  has no past surgical history on file.   Objective:   Vitals: BP 110/80   Pulse 65   Temp 99 F (37.2 C) (Oral)   Resp 18   Ht 6' 1.62" (1.87 m)   Wt 218 lb 3.2 oz (99 kg)   SpO2 97%   BMI 28.30 kg/m   Physical Exam  Constitutional: He is oriented to person, place, and time. He appears well-developed and well-nourished. No distress.  HENT:  Head: Normocephalic and atraumatic.  Right Ear: Tympanic membrane, external ear and ear canal normal.  Left Ear: Tympanic membrane, external ear and ear canal normal.  Nose: No mucosal edema. Right sinus exhibits no maxillary sinus tenderness and no frontal sinus tenderness. Left sinus exhibits no maxillary sinus tenderness and no frontal sinus tenderness.  Mouth/Throat: Uvula is midline and mucous membranes are normal. Posterior oropharyngeal erythema present. No posterior oropharyngeal edema or tonsillar abscesses. Tonsils are 2+ on the right. Tonsils are 2+ on the left. No tonsillar exudate.  Tonsils are erythematous b/l  Eyes: Conjunctivae are normal.  Neck: Normal  range of motion and full passive range of motion without pain. Neck supple. No Brudzinski's sign and no Kernig's sign noted.  Cardiovascular: Normal rate, regular rhythm, normal heart sounds and intact distal pulses.  Pulmonary/Chest: Effort normal and breath sounds normal. He has no decreased breath sounds. He has no wheezes. He has no rhonchi. He has no rales.  Lymphadenopathy:       Head (right side): No submental, no submandibular, no tonsillar, no preauricular, no posterior auricular and no occipital adenopathy present.       Head (left side): No submental, no submandibular, no tonsillar, no preauricular, no posterior auricular and no occipital adenopathy present.    He has no cervical adenopathy.       Right: No supraclavicular adenopathy present.       Left: No supraclavicular adenopathy present.  Neurological: He is alert and oriented to person, place, and time.  Skin: Skin is warm and dry.  Psychiatric: He has a normal mood and affect.  Vitals reviewed.   Results for orders placed or performed in visit on 09/10/17 (from the past 24 hour(s))  POCT rapid strep A     Status: None   Collection Time: 09/10/17 12:18 PM  Result Value Ref Range   Rapid Strep A Screen Negative Negative    Assessment and Plan :  1. Acute upper respiratory infection - Likely viral in etiology d/t reassuring physical exam findings and labs. - Advised supportive care, offered symptomatic relief. - Return to clinic if symptoms worsen or fail  to improve in 5-7 days, otherwise return to clinic as needed. - benzonatate (TESSALON) 100 MG capsule; Take 1-2 capsules (100-200 mg total) by mouth 3 (three) times daily as needed for cough.  Dispense: 40 capsule; Refill: 0  2. Sore throat - POCT rapid strep A - Culture, Group A Strep  3. Cough 4. Tobacco abuse Given educational material to read about smoking cessation and potential tx options. Follow up when he is ready to quit smoking.    Benjiman CoreBrittany Katie Faraone,  PA-C  [facility] at [facility] 09/10/2017 12:27 PM

## 2017-09-12 LAB — CULTURE, GROUP A STREP: STREP A CULTURE: NEGATIVE

## 2017-10-18 ENCOUNTER — Other Ambulatory Visit: Payer: Self-pay | Admitting: Emergency Medicine

## 2017-10-18 DIAGNOSIS — F329 Major depressive disorder, single episode, unspecified: Secondary | ICD-10-CM

## 2017-10-18 DIAGNOSIS — F32A Depression, unspecified: Secondary | ICD-10-CM

## 2018-01-28 DIAGNOSIS — H5203 Hypermetropia, bilateral: Secondary | ICD-10-CM | POA: Diagnosis not present

## 2018-02-09 ENCOUNTER — Other Ambulatory Visit: Payer: Self-pay | Admitting: Emergency Medicine

## 2018-02-09 DIAGNOSIS — F32A Depression, unspecified: Secondary | ICD-10-CM

## 2018-02-09 DIAGNOSIS — F329 Major depressive disorder, single episode, unspecified: Secondary | ICD-10-CM

## 2018-02-10 NOTE — Telephone Encounter (Signed)
Please Advise, pt need OV for additional refills  Patient is requesting a refill of the following medications: Requested Prescriptions   Pending Prescriptions Disp Refills  . FLUoxetine (PROZAC) 20 MG tablet [Pharmacy Med Name: FLUOXETINE HCL 20 MG TABLET] 30 tablet 3    Sig: TAKE 1 TABLET BY MOUTH EVERY DAY

## 2018-02-10 NOTE — Telephone Encounter (Signed)
Requested medication (s) are due for refill today: Yes  Requested medication (s) are on the active medication list: Yes  Last refill:  10/18/17  Future visit scheduled: No  Notes to clinic:  Unable to refill, failed items on protocol, appointment needed.     Requested Prescriptions  Pending Prescriptions Disp Refills   FLUoxetine (PROZAC) 20 MG tablet [Pharmacy Med Name: FLUOXETINE HCL 20 MG TABLET] 30 tablet 3    Sig: TAKE 1 TABLET BY MOUTH EVERY DAY     Psychiatry:  Antidepressants - SSRI Failed - 02/10/2018 11:10 AM      Failed - Valid encounter within last 6 months    Recent Outpatient Visits          5 months ago Acute upper respiratory infection   Primary Care at [facility], [place] D, PA-C   8 months ago Flu-like symptoms   Primary Care at [facility], Myrle Sheng, MD   8 months ago Encounter for general adult medical examination with abnormal findings   Primary Care at [facility], Eilleen Kempf, MD   3 years ago Depression   [facility] at [facility], MD   4 years ago Depression   Primary Care at [facility], Gwenlyn Found, MD             Passed - Completed PHQ-2 or PHQ-9 in the last 360 days.

## 2018-02-10 NOTE — Telephone Encounter (Signed)
Patient called, unable to leave VM due to mailbox full. Patient will need to establish with a provider.

## 2018-03-05 ENCOUNTER — Other Ambulatory Visit: Payer: Self-pay | Admitting: Physician Assistant

## 2018-03-05 DIAGNOSIS — D485 Neoplasm of uncertain behavior of skin: Secondary | ICD-10-CM | POA: Diagnosis not present

## 2018-04-13 ENCOUNTER — Other Ambulatory Visit: Payer: Self-pay | Admitting: Emergency Medicine

## 2018-04-13 DIAGNOSIS — F32A Depression, unspecified: Secondary | ICD-10-CM

## 2018-04-13 DIAGNOSIS — F329 Major depressive disorder, single episode, unspecified: Secondary | ICD-10-CM

## 2018-04-14 NOTE — Telephone Encounter (Signed)
Patient is requesting a refill of the following medications: Requested Prescriptions   Pending Prescriptions Disp Refills  . FLUoxetine (PROZAC) 20 MG tablet [Pharmacy Med Name: FLUOXETINE HCL 20 MG TABLET] 90 tablet 2    Sig: TAKE 1 TABLET BY MOUTH EVERY DAY    Date of patient request: 04/14/2018 Last office visit: 09/10/2017 for URI and 05/24/2017 for depression Date of last refill: 02/10/2018 Last refill amount:30 with 3 refills Follow up time period per chart: n/a

## 2018-04-14 NOTE — Telephone Encounter (Signed)
Requested medication (s) are due for refill today: yes  Requested medication (s) are on the active medication list: yes  Last refill:  02/10/18 #30 with 3 refills  Future visit scheduled: No  Notes to clinic:  Request for 90 day supply. No future appt scheduled at this time    Requested Prescriptions  Pending Prescriptions Disp Refills   FLUoxetine (PROZAC) 20 MG tablet [Pharmacy Med Name: FLUOXETINE HCL 20 MG TABLET] 90 tablet 2    Sig: TAKE 1 TABLET BY MOUTH EVERY DAY     Psychiatry:  Antidepressants - SSRI Failed - 04/13/2018  8:32 AM      Failed - Valid encounter within last 6 months    Recent Outpatient Visits          7 months ago Acute upper respiratory infection   Primary Care at [place], [place] D, PA-C   10 months ago Flu-like symptoms   Primary Care at [facility], Myrle Sheng, MD   10 months ago Encounter for general adult medical examination with abnormal findings   Primary Care at [facility], Eilleen Kempf, MD   3 years ago Depression   [facility] at [facility], MD   4 years ago Depression   Primary Care at [facility], Gwenlyn Found, MD             Passed - Completed PHQ-2 or PHQ-9 in the last 360 days.

## 2018-09-18 ENCOUNTER — Other Ambulatory Visit: Payer: Self-pay

## 2018-09-18 ENCOUNTER — Encounter: Payer: Self-pay | Admitting: Emergency Medicine

## 2018-09-18 ENCOUNTER — Ambulatory Visit (INDEPENDENT_AMBULATORY_CARE_PROVIDER_SITE_OTHER): Payer: BC Managed Care – PPO | Admitting: Emergency Medicine

## 2018-09-18 VITALS — BP 123/78 | HR 68 | Temp 98.7°F | Resp 16 | Ht 72.0 in | Wt 219.0 lb

## 2018-09-18 DIAGNOSIS — F331 Major depressive disorder, recurrent, moderate: Secondary | ICD-10-CM | POA: Diagnosis not present

## 2018-09-18 MED ORDER — FLUOXETINE HCL 40 MG PO CAPS: 40.0000 mg | ORAL_CAPSULE | Freq: Every day | ORAL | 3 refills | Status: DC

## 2018-09-18 NOTE — Progress Notes (Signed)
Shawn Logan 38 y.o.   Chief Complaint  Patient presents with  . Medication Problem    per patient Prozac is not helping and wants to increase dosage    HISTORY OF PRESENT ILLNESS: This is a 37 y.o. male with history of depression seen by me on 05/24/2017 and started on Prozac 20 mg daily.  Followed up with a psychiatrist several times with good results.  Responded well to medication and did well but recently symptoms came back most likely exacerbated by the pandemic and all the other stressful events presently happening in our society.  Requesting dosage increase of Prozac.  Plans on following up with his psychiatrist.  No suicidal ideas. Depression screen [facility] 2/9 09/18/2018 09/10/2017 06/03/2017 05/24/2017 06/18/2014  Decreased Interest 2 0 0 3 0  Down, Depressed, Hopeless 2 0 0 2 3  PHQ - 2 Score 4 0 0 5 3  Altered sleeping 3 - - 3 3  Tired, decreased energy 3 - - 3 3  Change in appetite 2 - - 1 0  Feeling bad or failure about yourself  2 - - 1 3  Trouble concentrating 2 - - 3 3  Moving slowly or fidgety/restless 1 - - 3 3  Suicidal thoughts 0 - - 0 1  PHQ-9 Score 17 - - 19 19  Difficult doing work/chores - - - - Somewhat difficult    HPI   Prior to Admission medications   Medication Sig Start Date End Date Taking? Authorizing Provider  FLUoxetine (PROZAC) 20 MG tablet TAKE 1 TABLET BY MOUTH EVERY DAY 04/14/18  Yes Arhan Mcmanamon, Eilleen Kempf, MD    No Known Allergies  Patient Active Problem List   Diagnosis Date Noted  . Alcohol use   . Dyslipidemia   . Tobacco abuse 04/09/2015    Past Medical History:  Diagnosis Date  . Anxiety   . Depression     History reviewed. No pertinent surgical history.  Social History   Socioeconomic History  . Marital status: Married    Spouse name: Perry  . Number of children: 1  . Years of education: Not on file  . Highest education level: Not on file  Occupational History  . Not on file  Social Needs  . Financial resource strain: Not  on file  . Food insecurity    Worry: Not on file    Inability: Not on file  . Transportation needs    Medical: Not on file    Non-medical: Not on file  Tobacco Use  . Smoking status: Current Every Day Smoker    Packs/day: 1.50    Types: Cigarettes  . Smokeless tobacco: Never Used  Substance and Sexual Activity  . Alcohol use: Yes    Alcohol/week: 12.0 standard drinks    Types: 12 Cans of beer per week    Comment: weekend   . Drug use: No  . Sexual activity: Yes  Lifestyle  . Physical activity    Days per week: Not on file    Minutes per session: Not on file  . Stress: Not on file  Relationships  . Social Musician on phone: Not on file    Gets together: Not on file    Attends religious service: Not on file    Active member of club or organization: Not on file    Attends meetings of clubs or organizations: Not on file    Relationship status: Not on file  . Intimate partner violence  Fear of current or ex partner: Not on file    Emotionally abused: Not on file    Physically abused: Not on file    Forced sexual activity: Not on file  Other Topics Concern  . Not on file  Social History Narrative  . Not on file    Family History  Problem Relation Age of Onset  . Alcohol abuse Father   . Drug abuse Sister        died 6 weeks ago  . Cancer Maternal Grandmother   . Cancer Maternal Grandfather        lung     Review of Systems  Constitutional: Negative.  Negative for chills and fever.  HENT: Negative for congestion and sore throat.   Eyes: Negative.  Negative for blurred vision and double vision.  Respiratory: Negative.  Negative for cough and shortness of breath.   Cardiovascular: Negative.  Negative for chest pain and palpitations.  Gastrointestinal: Negative for abdominal pain, nausea and vomiting.  Musculoskeletal: Negative for back pain, myalgias and neck pain.  Skin: Negative.  Negative for rash.  Neurological: Negative for dizziness and  headaches.  Psychiatric/Behavioral: Positive for depression. The patient is nervous/anxious.   All other systems reviewed and are negative.  Today's Vitals   09/18/18 1610  BP: 123/78  Pulse: 68  Resp: 16  Temp: 98.7 F (37.1 C)  TempSrc: Oral  SpO2: 97%  Weight: 219 lb (99.3 kg)  Height: 6' (1.829 m)   Body mass index is 29.7 kg/m.   Physical Exam Vitals signs reviewed.  Constitutional:      Appearance: Normal appearance.  HENT:     Head: Normocephalic.  Eyes:     Extraocular Movements: Extraocular movements intact.     Conjunctiva/sclera: Conjunctivae normal.     Pupils: Pupils are equal, round, and reactive to light.  Neck:     Musculoskeletal: Normal range of motion and neck supple.  Cardiovascular:     Rate and Rhythm: Normal rate and regular rhythm.     Heart sounds: Normal heart sounds.  Pulmonary:     Effort: Pulmonary effort is normal.     Breath sounds: Normal breath sounds.  Musculoskeletal: Normal range of motion.  Skin:    General: Skin is warm and dry.     Capillary Refill: Capillary refill takes less than 2 seconds.  Neurological:     General: No focal deficit present.     Mental Status: He is alert and oriented to person, place, and time.  Psychiatric:        Mood and Affect: Mood normal.        Behavior: Behavior normal.     A total of 25 minutes was spent in the room with the patient, greater than 50% of which was in counseling/coordination of care regarding depression, coping mechanisms, increasing medication dose and side effects, need for psychiatric follow-up, prognosis and need for follow-up here as well.  ASSESSMENT & PLAN: Moderate episode of recurrent major depressive disorder (HCC) Presently active.  Will increase Prozac to 40 mg a day.  Advised to follow-up with his psychiatrist.  Follow-up here in 3-6 months.  Earlier if any problems.  Bren was seen today for medication problem.  Diagnoses and all orders for this visit:   Moderate episode of recurrent major depressive disorder (HCC) -     FLUoxetine (PROZAC) 40 MG capsule; Take 1 capsule (40 mg total) by mouth daily.    Patient Instructions  If you have lab work done today you will be contacted with your lab results within the next 2 weeks.  If you have not heard from us then please contact us. The fastest way to get your results is to register for My Chart.   IF you received an x-ray today, you will receive an invoice from [facility]. Please contact [facility] at [phone] with questions or concerns regarding your invoice.   IF you received labwork today, you will receive an invoice from [facility]. Please contact [facility] at [phone] with questions or concerns regarding your invoice.   Our billing staff will not be able to assist you with questions regarding bills from these companies.  You will be contacted with the lab results as soon as they are available. The fastest way to get your results is to activate your My Chart account. Instructions are located on the last page of this paperwork. If you have not heard from us regarding the results in 2 weeks, please contact this office.     Persistent Depressive Disorder  Persistent depressive disorder (PDD) is a mental health condition. PDD causes symptoms of low-level depression for 2 years or longer. It may also be called long-term (chronic) depression or dysthymia. PDD may include episodes of more severe depression that last for about 2 weeks (major depressive disorder or MDD). PDD can affect the way you think, feel, and sleep. This condition may also affect your relationships. You may be more likely to get sick if you have PDD. Symptoms of PDD occur for most of the day and may include:  Feeling tired (fatigue).  Low energy.  Eating too much or too little.  Sleeping too much or too little.  Feeling restless or agitated.  Feeling hopeless.  Feeling worthless or  guilty.  Feeling worried or nervous (anxiety).  Trouble concentrating or making decisions.  Low self-esteem.  A negative way of looking at things (outlook).  Not being able to have fun or feel pleasure.  Avoiding interacting with people.  Getting angry or annoyed easily (irritability).  Acting aggressive or angry. Follow these instructions at home: Activity  Go back to your normal activities as told by your doctor.  Exercise regularly as told by your doctor. General instructions  Take over-the-counter and prescription medicines only as told by your doctor.  Do not drink alcohol. Or, limit how much alcohol you drink to no more than 1 drink a day for nonpregnant women and 2 drinks a day for men. One drink equals 12 oz of beer, 5 oz of wine, or 1 oz of hard liquor. Alcohol can affect any antidepressant medicines you are taking. Talk with your doctor about your alcohol use.  Eat a healthy diet and get plenty of sleep.  Find activities that you enjoy each day.  Consider joining a support group. Your doctor may be able to suggest a support group.  Keep all follow-up visits as told by your doctor. This is important. Where to find more information [employer]ness  www.nami.org [employer]  http://www.maynard.net/www.nimh.nih.gov National Suicide Prevention Lifeline  (608) 635-2126(1-719-758-7138).  This is free, 24-hour help. Contact a doctor if:  Your symptoms get worse.  You have new symptoms.  You have trouble sleeping or doing your daily activities. Get help right away if:  You self-harm.  You have serious thoughts about hurting yourself or others.  You see, hear, taste, smell, or feel things that are not there (hallucinate). This information  is not intended to replace advice given to you by your health care provider. Make sure you discuss any questions you have with your health care provider. Document Released: 12/20/2014 Document Revised:  12/21/2016 Document Reviewed: 09/02/2015 Elsevier Patient Education  2020 Elsevier Inc.      Agustina Caroli, MD [facility]

## 2018-09-18 NOTE — Assessment & Plan Note (Signed)
Presently active.  Will increase Prozac to 40 mg a day.  Advised to follow-up with his psychiatrist.  Follow-up here in 3-6 months.  Earlier if any problems.

## 2018-09-18 NOTE — Patient Instructions (Addendum)
   If you have lab work done today you will be contacted with your lab results within the next 2 weeks.  If you have not heard from us then please contact us. The fastest way to get your results is to register for My Chart.   IF you received an x-ray today, you will receive an invoice from  Radiology. Please contact  Radiology at [phone] with questions or concerns regarding your invoice.   IF you received labwork today, you will receive an invoice from [facility]. Please contact [facility] at [phone] with questions or concerns regarding your invoice.   Our billing staff will not be able to assist you with questions regarding bills from these companies.  You will be contacted with the lab results as soon as they are available. The fastest way to get your results is to activate your My Chart account. Instructions are located on the last page of this paperwork. If you have not heard from us regarding the results in 2 weeks, please contact this office.     Persistent Depressive Disorder  Persistent depressive disorder (PDD) is a mental health condition. PDD causes symptoms of low-level depression for 2 years or longer. It may also be called long-term (chronic) depression or dysthymia. PDD may include episodes of more severe depression that last for about 2 weeks (major depressive disorder or MDD). PDD can affect the way you think, feel, and sleep. This condition may also affect your relationships. You may be more likely to get sick if you have PDD. Symptoms of PDD occur for most of the day and may include:  Feeling tired (fatigue).  Low energy.  Eating too much or too little.  Sleeping too much or too little.  Feeling restless or agitated.  Feeling hopeless.  Feeling worthless or guilty.  Feeling worried or nervous (anxiety).  Trouble concentrating or making decisions.  Low self-esteem.  A negative way of looking at things (outlook).  Not being able  to have fun or feel pleasure.  Avoiding interacting with people.  Getting angry or annoyed easily (irritability).  Acting aggressive or angry. Follow these instructions at home: Activity  Go back to your normal activities as told by your doctor.  Exercise regularly as told by your doctor. General instructions  Take over-the-counter and prescription medicines only as told by your doctor.  Do not drink alcohol. Or, limit how much alcohol you drink to no more than 1 drink a day for nonpregnant women and 2 drinks a day for men. One drink equals 12 oz of beer, 5 oz of wine, or 1 oz of hard liquor. Alcohol can affect any antidepressant medicines you are taking. Talk with your doctor about your alcohol use.  Eat a healthy diet and get plenty of sleep.  Find activities that you enjoy each day.  Consider joining a support group. Your doctor may be able to suggest a support group.  Keep all follow-up visits as told by your doctor. This is important. Where to find more information National Alliance on Mental Illness  www.nami.org [place] National Institute of Mental Health  www.nimh.nih.gov National Suicide Prevention Lifeline  (1-800-273-8255).  This is free, 24-hour help. Contact a doctor if:  Your symptoms get worse.  You have new symptoms.  You have trouble sleeping or doing your daily activities. Get help right away if:  You self-harm.  You have serious thoughts about hurting yourself or others.  You see, hear, taste, smell, or feel things that are not there (  hallucinate). This information is not intended to replace advice given to you by your health care provider. Make sure you discuss any questions you have with your health care provider. Document Released: 12/20/2014 Document Revised: 12/21/2016 Document Reviewed: 09/02/2015 Elsevier Patient Education  2020 [employer].

## 2019-03-19 ENCOUNTER — Ambulatory Visit: Payer: BC Managed Care – PPO | Admitting: Emergency Medicine

## 2019-03-19 ENCOUNTER — Encounter: Payer: Self-pay | Admitting: Emergency Medicine

## 2019-03-19 ENCOUNTER — Other Ambulatory Visit: Payer: Self-pay

## 2019-03-19 VITALS — BP 118/79 | HR 66 | Temp 98.1°F | Resp 16 | Ht 73.0 in | Wt 230.0 lb

## 2019-03-19 DIAGNOSIS — E785 Hyperlipidemia, unspecified: Secondary | ICD-10-CM | POA: Diagnosis not present

## 2019-03-19 DIAGNOSIS — Z720 Tobacco use: Secondary | ICD-10-CM

## 2019-03-19 DIAGNOSIS — Z716 Tobacco abuse counseling: Secondary | ICD-10-CM

## 2019-03-19 DIAGNOSIS — Z8659 Personal history of other mental and behavioral disorders: Secondary | ICD-10-CM

## 2019-03-19 DIAGNOSIS — F331 Major depressive disorder, recurrent, moderate: Secondary | ICD-10-CM

## 2019-03-19 NOTE — Patient Instructions (Addendum)
If you have lab work done today you will be contacted with your lab results within the next 2 weeks.  If you have not heard from [place] then please contact us. The fastest way to get your results is to register for My Chart.   IF you received an x-ray today, you will receive an invoice from [facility]. Please contact [facility] at [phone] with questions or concerns regarding your invoice.   IF you received labwork today, you will receive an invoice from [facility]. Please contact [facility] at [phone] with questions or concerns regarding your invoice.   Our billing staff will not be able to assist you with questions regarding bills from these companies.  You will be contacted with the lab results as soon as they are available. The fastest way to get your results is to activate your My Chart account. Instructions are located on the last page of this paperwork. If you have not heard from [place] regarding the results in 2 weeks, please contact this office.     Steps to Quit Smoking Smoking tobacco is the leading cause of preventable death. It can affect almost every organ in the body. Smoking puts you and people around you at risk for many serious, long-lasting (chronic) diseases. Quitting smoking can be hard, but it is one of the best things that you can do for your health. It is never too late to quit. How do I get ready to quit? When you decide to quit smoking, make a plan to help you succeed. Before you quit:  Pick a date to quit. Set a date within the next 2 weeks to give you time to prepare.  Write down the reasons why you are quitting. Keep this list in places where you will see it often.  Tell your family, friends, and co-workers that you are quitting. Their support is important.  Talk with your doctor about the choices that may help you quit.  Find out if your health insurance will pay for these treatments.  Know the people, places, things, and activities  that make you want to smoke (triggers). Avoid them. What first steps can I take to quit smoking?  Throw away all cigarettes at home, at work, and in your car.  Throw away the things that you use when you smoke, such as ashtrays and lighters.  Clean your car. Make sure to empty the ashtray.  Clean your home, including curtains and carpets. What can I do to help me quit smoking? Talk with your doctor about taking medicines and seeing a counselor at the same time. You are more likely to succeed when you do both.  If you are pregnant or breastfeeding, talk with your doctor about counseling or other ways to quit smoking. Do not take medicine to help you quit smoking unless your doctor tells you to do so. To quit smoking: Quit right away  Quit smoking totally, instead of slowly cutting back on how much you smoke over a period of time.  Go to counseling. You are more likely to quit if you go to counseling sessions regularly. Take medicine You may take medicines to help you quit. Some medicines need a prescription, and some you can buy over-the-counter. Some medicines may contain a drug called nicotine to replace the nicotine in cigarettes. Medicines may:  Help you to stop having the desire to smoke (cravings).  Help to stop the problems that come when you stop smoking (withdrawal symptoms). Your doctor may  ask you to use:  Nicotine patches, gum, or lozenges.  Nicotine inhalers or sprays.  Non-nicotine medicine that is taken by mouth. Find resources Find resources and other ways to help you quit smoking and remain smoke-free after you quit. These resources are most helpful when you use them often. They include:  Online chats with a Social worker.  Phone quitlines.  Printed Furniture conservator/restorer.  Support groups or group counseling.  Text messaging programs.  Mobile phone apps. Use apps on your mobile phone or tablet that can help you stick to your quit plan. There are many free apps  for mobile phones and tablets as well as websites. Examples include Quit Guide from the [employer] and smokefree.gov  What things can I do to make it easier to quit?   Talk to your family and friends. Ask them to support and encourage you.  Call a phone quitline (1-800-QUIT-NOW), reach out to support groups, or work with a Social worker.  Ask people who smoke to not smoke around you.  Avoid places that make you want to smoke, such as: ? Bars. ? Parties. ? Smoke-break areas at work.  Spend time with people who do not smoke.  Lower the stress in your life. Stress can make you want to smoke. Try these things to help your stress: ? Getting regular exercise. ? Doing deep-breathing exercises. ? Doing yoga. ? Meditating. ? Doing a body scan. To do this, close your eyes, focus on one area of your body at a time from head to toe. Notice which parts of your body are tense. Try to relax the muscles in those areas. How will I feel when I quit smoking? Day 1 to 3 weeks Within the first 24 hours, you may start to have some problems that come from quitting tobacco. These problems are very bad 2-3 days after you quit, but they do not often last for more than 2-3 weeks. You may get these symptoms:  Mood swings.  Feeling restless, nervous, angry, or annoyed.  Trouble concentrating.  Dizziness.  Strong desire for high-sugar foods and nicotine.  Weight gain.  Trouble pooping (constipation).  Feeling like you may vomit (nausea).  Coughing or a sore throat.  Changes in how the medicines that you take for other issues work in your body.  Depression.  Trouble sleeping (insomnia). Week 3 and afterward After the first 2-3 weeks of quitting, you may start to notice more positive results, such as:  Better sense of smell and taste.  Less coughing and sore throat.  Slower heart rate.  Lower blood pressure.  Clearer skin.  Better breathing.  Fewer sick days. Quitting smoking can be hard. Do  not give up if you fail the first time. Some people need to try a few times before they succeed. Do your best to stick to your quit plan, and talk with your doctor if you have any questions or concerns. Summary  Smoking tobacco is the leading cause of preventable death. Quitting smoking can be hard, but it is one of the best things that you can do for your health.  When you decide to quit smoking, make a plan to help you succeed.  Quit smoking right away, not slowly over a period of time.  When you start quitting, seek help from your doctor, family, or friends. This information is not intended to replace advice given to you by your health care provider. Make sure you discuss any questions you have with your health care provider. Document Revised:  10/03/2018 Document Reviewed: 03/29/2018 Elsevier Patient Education  2020 [employer].  Health Maintenance, Male Adopting a healthy lifestyle and getting preventive care are important in promoting health and wellness. Ask your health care provider about:  The right schedule for you to have regular tests and exams.  Things you can do on your own to prevent diseases and keep yourself healthy. What should I know about diet, weight, and exercise? Eat a healthy diet   Eat a diet that includes plenty of vegetables, fruits, low-fat dairy products, and lean protein.  Do not eat a lot of foods that are high in solid fats, added sugars, or sodium. Maintain a healthy weight Body mass index (BMI) is a measurement that can be used to identify possible weight problems. It estimates body fat based on height and weight. Your health care provider can help determine your BMI and help you achieve or maintain a healthy weight. Get regular exercise Get regular exercise. This is one of the most important things you can do for your health. Most adults should:  Exercise for at least 150 minutes each week. The exercise should increase your heart rate and make you  sweat (moderate-intensity exercise).  Do strengthening exercises at least twice a week. This is in addition to the moderate-intensity exercise.  Spend less time sitting. Even light physical activity can be beneficial. Watch cholesterol and blood lipids Have your blood tested for lipids and cholesterol at 39 years of age, then have this test every 5 years. You may need to have your cholesterol levels checked more often if:  Your lipid or cholesterol levels are high.  You are older than 40 years of age.  You are at high risk for heart disease. What should I know about cancer screening? Many types of cancers can be detected early and may often be prevented. Depending on your health history and family history, you may need to have cancer screening at various ages. This may include screening for:  Colorectal cancer.  Prostate cancer.  Skin cancer.  Lung cancer. What should I know about heart disease, diabetes, and high blood pressure? Blood pressure and heart disease  High blood pressure causes heart disease and increases the risk of stroke. This is more likely to develop in people who have high blood pressure readings, are of African descent, or are overweight.  Talk with your health care provider about your target blood pressure readings.  Have your blood pressure checked: ? Every 3-5 years if you are 59-23 years of age. ? Every year if you are 39 years old or older.  If you are between the ages of 6 and 62 and are a current or former smoker, ask your health care provider if you should have a one-time screening for abdominal aortic aneurysm (AAA). Diabetes Have regular diabetes screenings. This checks your fasting blood sugar level. Have the screening done:  Once every three years after age 32 if you are at a normal weight and have a low risk for diabetes.  More often and at a younger age if you are overweight or have a high risk for diabetes. What should I know about  preventing infection? Hepatitis B If you have a higher risk for hepatitis B, you should be screened for this virus. Talk with your health care provider to find out if you are at risk for hepatitis B infection. Hepatitis C Blood testing is recommended for:  Everyone born from [birth year] through 1965.  Anyone with known risk factors for  hepatitis C. Sexually transmitted infections (STIs)  You should be screened each year for STIs, including gonorrhea and chlamydia, if: ? You are sexually active and are younger than 39 years of age. ? You are older than 39 years of age and your health care provider tells you that you are at risk for this type of infection. ? Your sexual activity has changed since you were last screened, and you are at increased risk for chlamydia or gonorrhea. Ask your health care provider if you are at risk.  Ask your health care provider about whether you are at high risk for HIV. Your health care provider may recommend a prescription medicine to help prevent HIV infection. If you choose to take medicine to prevent HIV, you should first get tested for HIV. You should then be tested every 3 months for as long as you are taking the medicine. Follow these instructions at home: Lifestyle  Do not use any products that contain nicotine or tobacco, such as cigarettes, e-cigarettes, and chewing tobacco. If you need help quitting, ask your health care provider.  Do not use street drugs.  Do not share needles.  Ask your health care provider for help if you need support or information about quitting drugs. Alcohol use  Do not drink alcohol if your health care provider tells you not to drink.  If you drink alcohol: ? Limit how much you have to 0-2 drinks a day. ? Be aware of how much alcohol is in your drink. In the [place] one drink equals one 12 oz bottle of beer (355 mL), one 5 oz glass of wine (148 mL), or one 1 oz glass of hard liquor (44 mL). General instructions  Schedule  regular health, dental, and eye exams.  Stay current with your vaccines.  Tell your health care provider if: ? You often feel depressed. ? You have ever been abused or do not feel safe at home. Summary  Adopting a healthy lifestyle and getting preventive care are important in promoting health and wellness.  Follow your health care provider's instructions about healthy diet, exercising, and getting tested or screened for diseases.  Follow your health care provider's instructions on monitoring your cholesterol and blood pressure. This information is not intended to replace advice given to you by your health care provider. Make sure you discuss any questions you have with your health care provider. Document Revised: 01/01/2018 Document Reviewed: 01/01/2018 Elsevier Patient Education  2020 [employer].

## 2019-03-19 NOTE — Assessment & Plan Note (Signed)
Much improved after Prozac treatment.  Situation causing depression/anxiety improved.  Patient inquiring about stopping medication.  Advised to wean off the medication by reducing dose to 20 mg a day for couple weeks and then take every other day and then perhaps just a couple times a week until he can completely stop it.

## 2019-03-19 NOTE — Progress Notes (Signed)
Shawn Logan 39 y.o.   Chief Complaint  Patient presents with  . Depression    6 mo follow up pt denies any side effects from the medication, pt notes had a few weeks where he forgot to take his prozac and states he could really tell a diffrence and that he does much better when he does take it regularly    HISTORY OF PRESENT ILLNESS: This is a 39 y.o. male with history of depression, has been on Prozac for the past year and a half.  Started taking it due to situational depression/anxiety.  Had good results but today inquiring about how long he should take it for. Blood work in the past showed abnormal lipid profile.  Not on medications.  Fasting today.  We will retest. Smoker.  Counseling regarding smoking cessation given. No other complaints or medical concerns today. Depression screen [facility] 2/9 03/19/2019 03/19/2019 09/18/2018 09/10/2017 06/03/2017  Decreased Interest 0 0 2 0 0  Down, Depressed, Hopeless 0 0 2 0 0  PHQ - 2 Score 0 0 4 0 0  Altered sleeping - - 3 - -  Tired, decreased energy - - 3 - -  Change in appetite - - 2 - -  Feeling bad or failure about yourself  - - 2 - -  Trouble concentrating - - 2 - -  Moving slowly or fidgety/restless - - 1 - -  Suicidal thoughts - - 0 - -  PHQ-9 Score - - 17 - -  Difficult doing work/chores - - - - -    HPI   Prior to Admission medications   Medication Sig Start Date End Date Taking? Authorizing Provider  FLUoxetine (PROZAC) 40 MG capsule Take 1 capsule (40 mg total) by mouth daily. 09/18/18 12/17/18  Georgina Quint, MD    No Known Allergies  Patient Active Problem List   Diagnosis Date Noted  . Moderate episode of recurrent major depressive disorder (HCC) 09/18/2018  . Alcohol use   . Dyslipidemia   . Tobacco abuse 04/09/2015    Past Medical History:  Diagnosis Date  . Anxiety   . Depression     History reviewed. No pertinent surgical history.  Social History   Socioeconomic History  . Marital status: Married     Spouse name: [place]  . Number of children: 1  . Years of education: Not on file  . Highest education level: Not on file  Occupational History  . Not on file  Tobacco Use  . Smoking status: Current Every Day Smoker    Packs/day: 1.50    Types: Cigarettes  . Smokeless tobacco: Never Used  Substance and Sexual Activity  . Alcohol use: Yes    Alcohol/week: 12.0 standard drinks    Types: 12 Cans of beer per week    Comment: weekend   . Drug use: No  . Sexual activity: Yes  Other Topics Concern  . Not on file  Social History Narrative  . Not on file   Social Determinants of Health   Financial Resource Strain:   . Difficulty of Paying Living Expenses: Not on file  Food Insecurity:   . Worried About Programme researcher, broadcasting/film/video in the Last Year: Not on file  . Ran Out of Food in the Last Year: Not on file  Transportation Needs:   . Lack of Transportation (Medical): Not on file  . Lack of Transportation (Non-Medical): Not on file  Physical Activity:   . Days of Exercise per  Week: Not on file  . Minutes of Exercise per Session: Not on file  Stress:   . Feeling of Stress : Not on file  Social Connections:   . Frequency of Communication with Friends and Family: Not on file  . Frequency of Social Gatherings with Friends and Family: Not on file  . Attends Religious Services: Not on file  . Active Member of Clubs or Organizations: Not on file  . Attends Archivist Meetings: Not on file  . Marital Status: Not on file  Intimate Partner Violence:   . Fear of Current or Ex-Partner: Not on file  . Emotionally Abused: Not on file  . Physically Abused: Not on file  . Sexually Abused: Not on file    Family History  Problem Relation Age of Onset  . Alcohol abuse Father   . Drug abuse Sister        died 27 weeks ago  . Cancer Maternal Grandmother   . Cancer Maternal Grandfather        lung     Review of Systems  Constitutional: Negative.  Negative for chills and fever.   HENT: Negative.  Negative for congestion and sore throat.   Respiratory: Negative.  Negative for cough and shortness of breath.   Cardiovascular: Negative.  Negative for chest pain and palpitations.  Gastrointestinal: Negative.  Negative for abdominal pain, diarrhea, nausea and vomiting.  Genitourinary: Negative.  Negative for dysuria and hematuria.  Musculoskeletal: Negative for back pain, myalgias and neck pain.  Skin: Negative.  Negative for rash.  Neurological: Negative.  Negative for dizziness and headaches.  All other systems reviewed and are negative.   Today's Vitals   03/19/19 1054  BP: 118/79  Pulse: 66  Resp: 16  Temp: 98.1 F (36.7 C)  TempSrc: Temporal  SpO2: 98%  Weight: 230 lb (104.3 kg)  Height: 6\' 1"  (1.854 m)   Body mass index is 30.34 kg/m.  Physical Exam Vitals reviewed.  Constitutional:      Appearance: Normal appearance.  HENT:     Head: Normocephalic.  Eyes:     Extraocular Movements: Extraocular movements intact.     Conjunctiva/sclera: Conjunctivae normal.     Pupils: Pupils are equal, round, and reactive to light.  Cardiovascular:     Rate and Rhythm: Normal rate and regular rhythm.     Pulses: Normal pulses.     Heart sounds: Normal heart sounds.  Pulmonary:     Effort: Pulmonary effort is normal.     Breath sounds: Normal breath sounds.  Abdominal:     General: There is no distension.     Palpations: Abdomen is soft.     Tenderness: There is no abdominal tenderness.  Musculoskeletal:        General: Normal range of motion.     Cervical back: Normal range of motion and neck supple.  Skin:    General: Skin is warm and dry.     Capillary Refill: Capillary refill takes less than 2 seconds.  Neurological:     General: No focal deficit present.     Mental Status: He is alert and oriented to person, place, and time.  Psychiatric:        Mood and Affect: Mood normal.        Behavior: Behavior normal.    A total of 30 minutes was spent  with the patient, greater than 50% of which was in counseling/coordination of care regarding depression and medication, smoking and smoking cessation,  diet and nutrition, dyslipidemia and need for blood work, review of most recent office visit notes, review of most recent blood work, prognosis, and need for follow-up.   ASSESSMENT & PLAN: Moderate episode of recurrent major depressive disorder (HCC) Much improved after Prozac treatment.  Situation causing depression/anxiety improved.  Patient inquiring about stopping medication.  Advised to wean off the medication by reducing dose to 20 mg a day for couple weeks and then take every other day and then perhaps just a couple times a week until he can completely stop it.  Ejay was seen today for depression.  Diagnoses and all orders for this visit:  Dyslipidemia -     CBC with Differential/Platelet -     Comprehensive metabolic panel -     Lipid panel  Tobacco abuse -     Ambulatory referral to Smoking Cessation Program  History of depression  Tobacco abuse counseling -     Ambulatory referral to Smoking Cessation Program  Moderate episode of recurrent major depressive disorder [facility])    Patient Instructions       If you have lab work done today you will be contacted with your lab results within the next 2 weeks.  If you have not heard from [place] then please contact us. The fastest way to get your results is to register for My Chart.   IF you received an x-ray today, you will receive an invoice from [facility]. Please contact [facility] at [phone] with questions or concerns regarding your invoice.   IF you received labwork today, you will receive an invoice from [place]. Please contact [facility] at [phone] with questions or concerns regarding your invoice.   Our billing staff will not be able to assist you with questions regarding bills from these companies.  You will be contacted with the lab results  as soon as they are available. The fastest way to get your results is to activate your My Chart account. Instructions are located on the last page of this paperwork. If you have not heard from [place] regarding the results in 2 weeks, please contact this office.     Steps to Quit Smoking Smoking tobacco is the leading cause of preventable death. It can affect almost every organ in the body. Smoking puts you and people around you at risk for many serious, long-lasting (chronic) diseases. Quitting smoking can be hard, but it is one of the best things that you can do for your health. It is never too late to quit. How do I get ready to quit? When you decide to quit smoking, make a plan to help you succeed. Before you quit:  Pick a date to quit. Set a date within the next 2 weeks to give you time to prepare.  Write down the reasons why you are quitting. Keep this list in places where you will see it often.  Tell your family, friends, and co-workers that you are quitting. Their support is important.  Talk with your doctor about the choices that may help you quit.  Find out if your health insurance will pay for these treatments.  Know the people, places, things, and activities that make you want to smoke (triggers). Avoid them. What first steps can I take to quit smoking?  Throw away all cigarettes at home, at work, and in your car.  Throw away the things that you use when you smoke, such as ashtrays and lighters.  Clean your car. Make sure to empty the ashtray.  Clean your home, including curtains and carpets. What can I do to help me quit smoking? Talk with your doctor about taking medicines and seeing a counselor at the same time. You are more likely to succeed when you do both.  If you are pregnant or breastfeeding, talk with your doctor about counseling or other ways to quit smoking. Do not take medicine to help you quit smoking unless your doctor tells you to do so. To quit smoking: Quit  right away  Quit smoking totally, instead of slowly cutting back on how much you smoke over a period of time.  Go to counseling. You are more likely to quit if you go to counseling sessions regularly. Take medicine You may take medicines to help you quit. Some medicines need a prescription, and some you can buy over-the-counter. Some medicines may contain a drug called nicotine to replace the nicotine in cigarettes. Medicines may:  Help you to stop having the desire to smoke (cravings).  Help to stop the problems that come when you stop smoking (withdrawal symptoms). Your doctor may ask you to use:  Nicotine patches, gum, or lozenges.  Nicotine inhalers or sprays.  Non-nicotine medicine that is taken by mouth. Find resources Find resources and other ways to help you quit smoking and remain smoke-free after you quit. These resources are most helpful when you use them often. They include:  Online chats with a Veterinary surgeoncounselor.  Phone quitlines.  Printed Materials engineerself-help materials.  Support groups or group counseling.  Text messaging programs.  Mobile phone apps. Use apps on your mobile phone or tablet that can help you stick to your quit plan. There are many free apps for mobile phones and tablets as well as websites. Examples include Quit Guide from the [employer] and smokefree.gov  What things can I do to make it easier to quit?   Talk to your family and friends. Ask them to support and encourage you.  Call a phone quitline (1-800-QUIT-NOW), reach out to support groups, or work with a Veterinary surgeoncounselor.  Ask people who smoke to not smoke around you.  Avoid places that make you want to smoke, such as: ? Bars. ? Parties. ? Smoke-break areas at work.  Spend time with people who do not smoke.  Lower the stress in your life. Stress can make you want to smoke. Try these things to help your stress: ? Getting regular exercise. ? Doing deep-breathing exercises. ? Doing yoga. ? Meditating. ? Doing a  body scan. To do this, close your eyes, focus on one area of your body at a time from head to toe. Notice which parts of your body are tense. Try to relax the muscles in those areas. How will I feel when I quit smoking? Day 1 to 3 weeks Within the first 24 hours, you may start to have some problems that come from quitting tobacco. These problems are very bad 2-3 days after you quit, but they do not often last for more than 2-3 weeks. You may get these symptoms:  Mood swings.  Feeling restless, nervous, angry, or annoyed.  Trouble concentrating.  Dizziness.  Strong desire for high-sugar foods and nicotine.  Weight gain.  Trouble pooping (constipation).  Feeling like you may vomit (nausea).  Coughing or a sore throat.  Changes in how the medicines that you take for other issues work in your body.  Depression.  Trouble sleeping (insomnia). Week 3 and afterward After the first 2-3 weeks of quitting, you may start to notice  more positive results, such as:  Better sense of smell and taste.  Less coughing and sore throat.  Slower heart rate.  Lower blood pressure.  Clearer skin.  Better breathing.  Fewer sick days. Quitting smoking can be hard. Do not give up if you fail the first time. Some people need to try a few times before they succeed. Do your best to stick to your quit plan, and talk with your doctor if you have any questions or concerns. Summary  Smoking tobacco is the leading cause of preventable death. Quitting smoking can be hard, but it is one of the best things that you can do for your health.  When you decide to quit smoking, make a plan to help you succeed.  Quit smoking right away, not slowly over a period of time.  When you start quitting, seek help from your doctor, family, or friends. This information is not intended to replace advice given to you by your health care provider. Make sure you discuss any questions you have with your health care  provider. Document Revised: 10/03/2018 Document Reviewed: 03/29/2018 Elsevier Patient Education  2020 [employer]  Health Maintenance, Male Adopting a healthy lifestyle and getting preventive care are important in promoting health and wellness. Ask your health care provider about:  The right schedule for you to have regular tests and exams.  Things you can do on your own to prevent diseases and keep yourself healthy. What should I know about diet, weight, and exercise? Eat a healthy diet   Eat a diet that includes plenty of vegetables, fruits, low-fat dairy products, and lean protein.  Do not eat a lot of foods that are high in solid fats, added sugars, or sodium. Maintain a healthy weight Body mass index (BMI) is a measurement that can be used to identify possible weight problems. It estimates body fat based on height and weight. Your health care provider can help determine your BMI and help you achieve or maintain a healthy weight. Get regular exercise Get regular exercise. This is one of the most important things you can do for your health. Most adults should:  Exercise for at least 150 minutes each week. The exercise should increase your heart rate and make you sweat (moderate-intensity exercise).  Do strengthening exercises at least twice a week. This is in addition to the moderate-intensity exercise.  Spend less time sitting. Even light physical activity can be beneficial. Watch cholesterol and blood lipids Have your blood tested for lipids and cholesterol at 39 years of age, then have this test every 5 years. You may need to have your cholesterol levels checked more often if:  Your lipid or cholesterol levels are high.  You are older than 39 years of age.  You are at high risk for heart disease. What should I know about cancer screening? Many types of cancers can be detected early and may often be prevented. Depending on your health history and family history, you may  need to have cancer screening at various ages. This may include screening for:  Colorectal cancer.  Prostate cancer.  Skin cancer.  Lung cancer. What should I know about heart disease, diabetes, and high blood pressure? Blood pressure and heart disease  High blood pressure causes heart disease and increases the risk of stroke. This is more likely to develop in people who have high blood pressure readings, are of African descent, or are overweight.  Talk with your health care provider about your target blood pressure readings.  Have your blood pressure checked: ? Every 3-5 years if you are 59-20 years of age. ? Every year if you are 79 years old or older.  If you are between the ages of 71 and 57 and are a current or former smoker, ask your health care provider if you should have a one-time screening for abdominal aortic aneurysm (AAA). Diabetes Have regular diabetes screenings. This checks your fasting blood sugar level. Have the screening done:  Once every three years after age 91 if you are at a normal weight and have a low risk for diabetes.  More often and at a younger age if you are overweight or have a high risk for diabetes. What should I know about preventing infection? Hepatitis B If you have a higher risk for hepatitis B, you should be screened for this virus. Talk with your health care provider to find out if you are at risk for hepatitis B infection. Hepatitis C Blood testing is recommended for:  Everyone born from [birth year] through 1965.  Anyone with known risk factors for hepatitis C. Sexually transmitted infections (STIs)  You should be screened each year for STIs, including gonorrhea and chlamydia, if: ? You are sexually active and are younger than 39 years of age. ? You are older than 39 years of age and your health care provider tells you that you are at risk for this type of infection. ? Your sexual activity has changed since you were last screened, and you are  at increased risk for chlamydia or gonorrhea. Ask your health care provider if you are at risk.  Ask your health care provider about whether you are at high risk for HIV. Your health care provider may recommend a prescription medicine to help prevent HIV infection. If you choose to take medicine to prevent HIV, you should first get tested for HIV. You should then be tested every 3 months for as long as you are taking the medicine. Follow these instructions at home: Lifestyle  Do not use any products that contain nicotine or tobacco, such as cigarettes, e-cigarettes, and chewing tobacco. If you need help quitting, ask your health care provider.  Do not use street drugs.  Do not share needles.  Ask your health care provider for help if you need support or information about quitting drugs. Alcohol use  Do not drink alcohol if your health care provider tells you not to drink.  If you drink alcohol: ? Limit how much you have to 0-2 drinks a day. ? Be aware of how much alcohol is in your drink. In the [place] one drink equals one 12 oz bottle of beer (355 mL), one 5 oz glass of wine (148 mL), or one 1 oz glass of hard liquor (44 mL). General instructions  Schedule regular health, dental, and eye exams.  Stay current with your vaccines.  Tell your health care provider if: ? You often feel depressed. ? You have ever been abused or do not feel safe at home. Summary  Adopting a healthy lifestyle and getting preventive care are important in promoting health and wellness.  Follow your health care provider's instructions about healthy diet, exercising, and getting tested or screened for diseases.  Follow your health care provider's instructions on monitoring your cholesterol and blood pressure. This information is not intended to replace advice given to you by your health care provider. Make sure you discuss any questions you have with your health care provider. Document Revised: 01/01/2018  Document Reviewed: 01/01/2018  Elsevier Patient Education  2020 [employer].      Edwina Barth, MD [facility]

## 2019-03-20 ENCOUNTER — Ambulatory Visit: Payer: BC Managed Care – PPO | Admitting: Emergency Medicine

## 2019-03-20 LAB — COMPREHENSIVE METABOLIC PANEL
ALT: 25 IU/L (ref 0–44)
AST: 21 IU/L (ref 0–40)
Albumin/Globulin Ratio: 2 (ref 1.2–2.2)
Albumin: 4.5 g/dL (ref 4.0–5.0)
Alkaline Phosphatase: 118 IU/L — ABNORMAL HIGH (ref 39–117)
BUN/Creatinine Ratio: 11 (ref 9–20)
BUN: 10 mg/dL (ref 6–20)
Bilirubin Total: 0.3 mg/dL (ref 0.0–1.2)
CO2: 20 mmol/L (ref 20–29)
Calcium: 9.2 mg/dL (ref 8.7–10.2)
Chloride: 105 mmol/L (ref 96–106)
Creatinine, Ser: 0.93 mg/dL (ref 0.76–1.27)
GFR calc Af Amer: 120 mL/min/{1.73_m2} (ref >59)
GFR calc non Af Amer: 104 mL/min/{1.73_m2} (ref >59)
Globulin, Total: 2.3 g/dL (ref 1.5–4.5)
Glucose: 100 mg/dL — ABNORMAL HIGH (ref 65–99)
Potassium: 4.6 mmol/L (ref 3.5–5.2)
Sodium: 139 mmol/L (ref 134–144)
Total Protein: 6.8 g/dL (ref 6.0–8.5)

## 2019-03-20 LAB — CBC WITH DIFFERENTIAL/PLATELET
Basophils Absolute: 0.1 10*3/uL (ref 0.0–0.2)
Basos: 1 %
EOS (ABSOLUTE): 0.4 10*3/uL (ref 0.0–0.4)
Eos: 4 %
Hematocrit: 48.7 % (ref 37.5–51.0)
Hemoglobin: 16.5 g/dL (ref 13.0–17.7)
Immature Grans (Abs): 0 10*3/uL (ref 0.0–0.1)
Immature Granulocytes: 0 %
Lymphocytes Absolute: 2.8 10*3/uL (ref 0.7–3.1)
Lymphs: 27 %
MCH: 28.3 pg (ref 26.6–33.0)
MCHC: 33.9 g/dL (ref 31.5–35.7)
MCV: 84 fL (ref 79–97)
Monocytes Absolute: 1.1 10*3/uL — ABNORMAL HIGH (ref 0.1–0.9)
Monocytes: 10 %
Neutrophils Absolute: 5.9 10*3/uL (ref 1.4–7.0)
Neutrophils: 58 %
Platelets: 271 10*3/uL (ref 150–450)
RBC: 5.83 x10E6/uL — ABNORMAL HIGH (ref 4.14–5.80)
RDW: 12.9 % (ref 11.6–15.4)
WBC: 10.4 10*3/uL (ref 3.4–10.8)

## 2019-03-20 LAB — LIPID PANEL
Chol/HDL Ratio: 5.2 ratio — ABNORMAL HIGH (ref 0.0–5.0)
Cholesterol, Total: 212 mg/dL — ABNORMAL HIGH (ref 100–199)
HDL: 41 mg/dL (ref >39)
LDL Chol Calc (NIH): 129 mg/dL — ABNORMAL HIGH (ref 0–99)
Triglycerides: 235 mg/dL — ABNORMAL HIGH (ref 0–149)
VLDL Cholesterol Cal: 42 mg/dL — ABNORMAL HIGH (ref 5–40)

## 2019-03-21 ENCOUNTER — Other Ambulatory Visit: Payer: Self-pay | Admitting: Emergency Medicine

## 2019-03-21 DIAGNOSIS — E785 Hyperlipidemia, unspecified: Secondary | ICD-10-CM

## 2019-03-21 MED ORDER — ROSUVASTATIN CALCIUM 10 MG PO TABS: 10.0000 mg | ORAL_TABLET | Freq: Every day | ORAL | 3 refills | Status: DC

## 2019-03-24 NOTE — Progress Notes (Signed)
Spoke with pt, he will follow up with pcp

## 2019-05-11 ENCOUNTER — Ambulatory Visit
Admission: EM | Admit: 2019-05-11 | Discharge: 2019-05-11 | Disposition: A | Discharge status: Home / Self Care | Payer: BC Managed Care – PPO | Attending: Emergency Medicine | Admitting: Emergency Medicine

## 2019-05-11 ENCOUNTER — Encounter: Payer: Self-pay | Admitting: Emergency Medicine

## 2019-05-11 ENCOUNTER — Other Ambulatory Visit: Payer: Self-pay

## 2019-05-11 DIAGNOSIS — F410 Panic disorder [episodic paroxysmal anxiety] without agoraphobia: Secondary | ICD-10-CM | POA: Diagnosis not present

## 2019-05-11 LAB — POCT FASTING CBG KUC MANUAL ENTRY: POCT Glucose (KUC): 104 mg/dL — AB (ref 70–99)

## 2019-05-11 NOTE — Discharge Instructions (Signed)
Important to follow up with PCP for further evaluation. Go to ER for repeat episode or you develop chest pain, difficulty breathing, vomiting, urinate on yourself.

## 2019-05-11 NOTE — ED Triage Notes (Signed)
Patient was in lobby floor.  Patient reports history of panic attacks.  Patient is here with his son and is very anxious about his son. Patient did not fall, patient lowered himself to the floor.  Said he felt like he was having a panic attack .  States he has had them before, this is how I felt in the past with panic attacks

## 2019-05-11 NOTE — ED Provider Notes (Signed)
[facility]    CSN: [id number] Arrival date & time: 05/11/19  1802      History   Chief Complaint Chief Complaint  Patient presents with  . Panic Attack    HPI Shawn Logan is a 39 y.o. male with history of anxiety, depression, alcohol and tobacco use presenting for panic attack.  Patient was in this urgent care lobby waiting for son to be seen when he felt the panic attack come on.  Endorsing nausea, "tunnel vision" as precursor to these attacks.  States that he has had presyncopal and syncopal events as a result of them in the past.  Patient states he lowered himself to the floor to help calm down: Denies head trauma, LOC.  No chest pain, difficulty breathing.  Patient denies history of seizures or change in alcohol consumption, medications.  Has history of diabetes.  Patient last ate/drank at 11:30 this morning.  Past Medical History:  Diagnosis Date  . Anxiety   . Depression     Patient Active Problem List   Diagnosis Date Noted  . Moderate episode of recurrent major depressive disorder (HCC) 09/18/2018  . Alcohol use   . Dyslipidemia   . Tobacco abuse 04/09/2015    History reviewed. No pertinent surgical history.     Home Medications    Prior to Admission medications   Medication Sig Start Date End Date Taking? Authorizing Provider  FLUoxetine (PROZAC) 40 MG capsule Take 1 capsule (40 mg total) by mouth daily. 09/18/18 12/17/18  Georgina Quint, MD  rosuvastatin (CRESTOR) 10 MG tablet Take 1 tablet (10 mg total) by mouth daily. 03/21/19   Georgina Quint, MD    Family History Family History  Problem Relation Age of Onset  . Alcohol abuse Father   . Drug abuse Sister        died 6 weeks ago  . Cancer Maternal Grandmother   . Cancer Maternal Grandfather        lung    Social History Social History   Tobacco Use  . Smoking status: Current Every Day Smoker    Packs/day: 1.50    Types: Cigarettes  . Smokeless tobacco: Never Used    Substance Use Topics  . Alcohol use: Yes    Alcohol/week: 12.0 standard drinks    Types: 12 Cans of beer per week    Comment: weekend   . Drug use: No     Allergies   Patient has no known allergies.   Review of Systems As per HPI   Physical Exam Triage Vital Signs ED Triage Vitals  Enc Vitals Group     BP 05/11/19 1807 107/67     Pulse Rate 05/11/19 1807 67     Resp 05/11/19 1807 (!) 22     Temp 05/11/19 1807 97.9 F (36.6 C)     Temp Source 05/11/19 1807 Oral     SpO2 05/11/19 1807 98 %     Weight --      Height --      Head Circumference --      Peak Flow --      Pain Score 05/11/19 1806 0     Pain Loc --      Pain Edu? --      Excl. in GC? --    No data found.  Updated Vital Signs BP 117/76 Comment: obtained by [place] H, PA  Pulse 67   Temp 97.9 F (36.6 C) (Oral)   Resp Marland Kitchen)  22   SpO2 98%   Visual Acuity Right Eye Distance:   Left Eye Distance:   Bilateral Distance:    Right Eye Near:   Left Eye Near:    Bilateral Near:     Physical Exam Constitutional:      General: He is not in acute distress.    Appearance: He is obese. He is not ill-appearing or diaphoretic.  HENT:     Head: Normocephalic and atraumatic.     Right Ear: Tympanic membrane, ear canal and external ear normal.     Left Ear: Tympanic membrane, ear canal and external ear normal.     Nose: Nose normal.     Mouth/Throat:     Mouth: Mucous membranes are moist.     Pharynx: Oropharynx is clear. No oropharyngeal exudate or posterior oropharyngeal erythema.  Eyes:     General: No scleral icterus.    Conjunctiva/sclera: Conjunctivae normal.     Pupils: Pupils are equal, round, and reactive to light.  Cardiovascular:     Rate and Rhythm: Normal rate and regular rhythm.     Heart sounds: No murmur. No gallop.   Pulmonary:     Effort: Pulmonary effort is normal. No respiratory distress.     Breath sounds: No wheezing.  Musculoskeletal:        General: Normal range of motion.      Cervical back: Neck supple. No tenderness.     Right lower leg: No edema.     Left lower leg: No edema.  Lymphadenopathy:     Cervical: No cervical adenopathy.  Skin:    Capillary Refill: Capillary refill takes less than 2 seconds.     Coloration: Skin is not jaundiced or pale.     Findings: No bruising.  Neurological:     Mental Status: He is alert and oriented to person, place, and time.      UC Treatments / Results  Labs (all labs ordered are listed, but only abnormal results are displayed) Labs Reviewed  POCT FASTING CBG [facility] MANUAL ENTRY - Abnormal; Notable for the following components:      Result Value   POCT Glucose ([facility]) 104 (*)    All other components within normal limits    EKG   Radiology No results found.  Procedures Procedures (including critical care time)  Medications Ordered in UC Medications - No data to display  Initial Impression / Assessment and Plan / UC Course  I have reviewed the triage vital signs and the nursing notes.  Pertinent labs & imaging results that were available during my care of the patient were reviewed by me and considered in my medical decision making (see chart for details).     Patient afebrile, nontoxic in office today.  Patient initially diaphoretic, presyncopal.  EKG done office, reviewed by me and compared to previous from 04/12/2015: Sinus bradycardia with ventricular rate 59 bpm.  No QTC prolongation, ST elevation or depression.  Waveforms stable in all leads.  Nonacute EKG.  CBG 104.  Reviewed findings with patient verbalized understanding.  Patient hemodynamically stable, denies SI/HI.  Patient observed for 30 minutes: No diaphoresis, labored breathing, chest pain.  Improved blood pressure and patient feels well.  Patient to follow-up with PCP for further evaluation/management of anxiety as needed.  Return precautions discussed, patient verbalized understanding and is agreeable to plan. Final Clinical Impressions(s) / UC  Diagnoses   Final diagnoses:  Anxiety attack     Discharge Instructions  Important to follow up with PCP for further evaluation. Go to ER for repeat episode or you develop chest pain, difficulty breathing, vomiting, urinate on yourself.    ED Prescriptions    None     PDMP not reviewed this encounter.   Hall[place], [place], [place] 05/12/19 [phone]

## 2019-08-10 ENCOUNTER — Other Ambulatory Visit: Payer: Self-pay | Admitting: Emergency Medicine

## 2019-08-10 ENCOUNTER — Other Ambulatory Visit: Payer: Self-pay | Admitting: *Deleted

## 2019-08-10 ENCOUNTER — Telehealth: Payer: Self-pay | Admitting: Emergency Medicine

## 2019-08-10 DIAGNOSIS — Z0000 Encounter for general adult medical examination without abnormal findings: Secondary | ICD-10-CM

## 2019-08-10 DIAGNOSIS — E785 Hyperlipidemia, unspecified: Secondary | ICD-10-CM

## 2019-08-10 NOTE — Telephone Encounter (Signed)
Cpe order has been placed

## 2019-08-10 NOTE — Telephone Encounter (Signed)
Pt needs orders in for CPE. Lab visit is sch for 10/05/19. Please advise.

## 2019-08-14 ENCOUNTER — Ambulatory Visit: Payer: BC Managed Care – PPO | Admitting: Registered Nurse

## 2019-08-14 ENCOUNTER — Other Ambulatory Visit: Payer: Self-pay

## 2019-08-14 ENCOUNTER — Encounter: Payer: Self-pay | Admitting: Registered Nurse

## 2019-08-14 VITALS — BP 111/76 | HR 82 | Temp 97.7°F | Resp 18 | Ht 73.0 in | Wt 219.0 lb

## 2019-08-14 DIAGNOSIS — F411 Generalized anxiety disorder: Secondary | ICD-10-CM

## 2019-08-14 DIAGNOSIS — F331 Major depressive disorder, recurrent, moderate: Secondary | ICD-10-CM | POA: Diagnosis not present

## 2019-08-14 MED ORDER — TRAZODONE HCL 50 MG PO TABS: 25.0000 mg | ORAL_TABLET | Freq: Every evening | ORAL | 3 refills | Status: DC | PRN

## 2019-08-14 MED ORDER — SERTRALINE HCL 25 MG PO TABS: 25.0000 mg | ORAL_TABLET | Freq: Every day | ORAL | 0 refills | Status: DC

## 2019-08-14 MED ORDER — FLUOXETINE HCL 20 MG PO TABS: 20.0000 mg | ORAL_TABLET | Freq: Every day | ORAL | 0 refills | Status: DC

## 2019-08-14 MED ORDER — SERTRALINE HCL 50 MG PO TABS: 50.0000 mg | ORAL_TABLET | Freq: Every day | ORAL | 3 refills | Status: DC

## 2019-08-14 NOTE — Progress Notes (Signed)
Established Patient Office Visit  Subjective:  Patient ID: Shawn Logan, male    DOB: Aug 31, 1980  Age: 39 y.o. MRN: [id number]  CC:  Chief Complaint  Patient presents with  . Referral    Patient states that he would to to discuss Prozac or see if he could get Referral to speak with someone for depression and Anxiety  . Depression    phq9=8 GAD7=11    HPI Shawn Logan presents for med review  Has been on fluoxetine 40mg  PO qd for almost 2 years but feels that effect has waned. Feeling like this suboptimal effect is leading to poor control of depression and anxiety. Does not like work or coworkers and unfortunately he is not enjoying things at home or his usual interests because of this. Hoping to discuss options and perhaps a referral to counseling or psychiatry.  Past Medical History:  Diagnosis Date  . Anxiety   . Depression     No past surgical history on file.  Family History  Problem Relation Age of Onset  . Alcohol abuse Father   . Drug abuse Sister        died 6 weeks ago  . Cancer Maternal Grandmother   . Cancer Maternal Grandfather        lung    Social History   Socioeconomic History  . Marital status: Married    Spouse name: [name]  . Number of children: 1  . Years of education: Not on file  . Highest education level: Not on file  Occupational History  . Not on file  Tobacco Use  . Smoking status: Current Every Day Smoker    Packs/day: 1.50    Types: Cigarettes  . Smokeless tobacco: Never Used  Vaping Use  . Vaping Use: Never used  Substance and Sexual Activity  . Alcohol use: Yes    Alcohol/week: 12.0 standard drinks    Types: 12 Cans of beer per week    Comment: weekend   . Drug use: No  . Sexual activity: Yes  Other Topics Concern  . Not on file  Social History Narrative  . Not on file   Social Determinants of Health   Financial Resource Strain:   . Difficulty of Paying Living Expenses:   Food Insecurity:   . Worried About [place] in the Last Year:   . Community education officer in the Last Year:   Transportation Needs:   . Barista (Medical):   Freight forwarder Lack of Transportation (Non-Medical):   Physical Activity:   . Days of Exercise per Week:   . Minutes of Exercise per Session:   Stress:   . Feeling of Stress :   Social Connections:   . Frequency of Communication with Friends and Family:   . Frequency of Social Gatherings with Friends and Family:   . Attends Religious Services:   . Active Member of Clubs or Organizations:   . Attends Marland Kitchen Meetings:   Banker Marital Status:   Intimate Partner Violence:   . Fear of Current or Ex-Partner:   . Emotionally Abused:   Marland Kitchen Physically Abused:   . Sexually Abused:     Outpatient Medications Prior to Visit  Medication Sig Dispense Refill  . rosuvastatin (CRESTOR) 10 MG tablet Take 1 tablet (10 mg total) by mouth daily. 90 tablet 3  . FLUoxetine (PROZAC) 40 MG capsule Take 1 capsule (40 mg total) by mouth daily. 90 capsule 3  No facility-administered medications prior to visit.    No Known Allergies  ROS Review of Systems Per hpi. Denies HI/SI   Objective:    Physical Exam Vitals and nursing note reviewed.  Constitutional:      General: He is not in acute distress.    Appearance: Normal appearance. He is normal weight. He is not ill-appearing, toxic-appearing or diaphoretic.  Cardiovascular:     Rate and Rhythm: Normal rate and regular rhythm.  Skin:    General: Skin is warm and dry.     Capillary Refill: Capillary refill takes less than 2 seconds.     Coloration: Skin is not jaundiced or pale.     Findings: No bruising, erythema, lesion or rash.  Neurological:     General: No focal deficit present.     Mental Status: He is alert and oriented to person, place, and time. Mental status is at baseline.  Psychiatric:        Attention and Perception: Attention and perception normal.        Mood and Affect: Mood is anxious and  depressed.        Speech: Speech normal.        Behavior: Behavior is withdrawn. Behavior is cooperative.        Thought Content: Thought content normal.        Judgment: Judgment normal.     BP 111/76   Pulse 82   Temp 97.7 F (36.5 C) (Temporal)   Resp 18   Ht 6\' 1"  (1.854 m)   Wt (!) 219 lb (99.3 kg)   SpO2 94%   BMI 28.89 kg/m  Wt Readings from Last 3 Encounters:  08/14/19 (!) 219 lb (99.3 kg)  03/19/19 230 lb (104.3 kg)  09/18/18 219 lb (99.3 kg)     There are no preventive care reminders to display for this patient.  There are no preventive care reminders to display for this patient.  No results found for: TSH Lab Results  Component Value Date   WBC 10.4 03/19/2019   HGB 16.5 03/19/2019   HCT 48.7 03/19/2019   MCV 84 03/19/2019   PLT 271 03/19/2019   Lab Results  Component Value Date   NA 139 03/19/2019   K 4.6 03/19/2019   CO2 20 03/19/2019   GLUCOSE 100 (H) 03/19/2019   BUN 10 03/19/2019   CREATININE 0.93 03/19/2019   BILITOT 0.3 03/19/2019   ALKPHOS 118 (H) 03/19/2019   AST 21 03/19/2019   ALT 25 03/19/2019   PROT 6.8 03/19/2019   ALBUMIN 4.5 03/19/2019   CALCIUM 9.2 03/19/2019   ANIONGAP 8 04/10/2015   Lab Results  Component Value Date   CHOL 212 (H) 03/19/2019   Lab Results  Component Value Date   HDL 41 03/19/2019   Lab Results  Component Value Date   LDLCALC 129 (H) 03/19/2019   Lab Results  Component Value Date   TRIG 235 (H) 03/19/2019   Lab Results  Component Value Date   CHOLHDL 5.2 (H) 03/19/2019   Lab Results  Component Value Date   HGBA1C 5.4 05/24/2017      Assessment & Plan:   Problem List Items Addressed This Visit      Other   Moderate episode of recurrent major depressive disorder (HCC) - Primary   Relevant Medications   FLUoxetine (PROZAC) 20 MG tablet   sertraline (ZOLOFT) 25 MG tablet   sertraline (ZOLOFT) 50 MG tablet   traZODone (DESYREL) 50 MG tablet  Other Relevant Orders   Ambulatory  referral to Psychiatry   Ambulatory referral to Psychology    Other Visit Diagnoses    GAD (generalized anxiety disorder)       Relevant Medications   FLUoxetine (PROZAC) 20 MG tablet   sertraline (ZOLOFT) 25 MG tablet   sertraline (ZOLOFT) 50 MG tablet   traZODone (DESYREL) 50 MG tablet   Other Relevant Orders   Ambulatory referral to Psychiatry   Ambulatory referral to Psychology      Meds ordered this encounter  Medications  . FLUoxetine (PROZAC) 20 MG tablet    Sig: Take 1 tablet (20 mg total) by mouth daily.    Dispense:  7 tablet    Refill:  0    Order Specific Question:   Supervising Provider    Answer:   Neva Seat, JEFFREY R [[id number]]  . sertraline (ZOLOFT) 25 MG tablet    Sig: Take 1 tablet (25 mg total) by mouth daily.    Dispense:  7 tablet    Refill:  0    Order Specific Question:   Supervising Provider    Answer:   Neva Seat, JEFFREY R [[id number]]  . sertraline (ZOLOFT) 50 MG tablet    Sig: Take 1 tablet (50 mg total) by mouth daily.    Dispense:  30 tablet    Refill:  3    Order Specific Question:   Supervising Provider    Answer:   Neva Seat, JEFFREY R [[id number]]  . traZODone (DESYREL) 50 MG tablet    Sig: Take 0.5-1 tablets (25-50 mg total) by mouth at bedtime as needed for sleep.    Dispense:  30 tablet    Refill:  3    Order Specific Question:   Supervising Provider    Answer:   Neva Seat, JEFFREY R [[id number]]    Follow-up: No follow-ups on file.   PLAN  20mg  fluoxetine PO qd for one week. Then 1 week washout. Then start 25mg  sertraline for one week. Then increase to 50mg  PO qd.   Trazodone 25-50mg  PO qhs PRN for anxiety before bed  Will refer to behavioral health for counseling, will refer to psychiatry for further medication management. We are optimistic that sertraline will work as he has had an immediate family member on this medication with good effect.  Patient encouraged to call clinic with any questions, comments, or concerns.  , NP

## 2019-10-05 ENCOUNTER — Other Ambulatory Visit: Payer: Self-pay

## 2019-10-05 ENCOUNTER — Ambulatory Visit (INDEPENDENT_AMBULATORY_CARE_PROVIDER_SITE_OTHER): Payer: BC Managed Care – PPO | Admitting: Emergency Medicine

## 2019-10-05 DIAGNOSIS — Z0000 Encounter for general adult medical examination without abnormal findings: Secondary | ICD-10-CM | POA: Diagnosis not present

## 2019-10-05 DIAGNOSIS — E785 Hyperlipidemia, unspecified: Secondary | ICD-10-CM

## 2019-10-06 LAB — LIPID PANEL
Chol/HDL Ratio: 4.5 ratio (ref 0.0–5.0)
Cholesterol, Total: 168 mg/dL (ref 100–199)
HDL: 37 mg/dL — ABNORMAL LOW (ref >39)
LDL Chol Calc (NIH): 107 mg/dL — ABNORMAL HIGH (ref 0–99)
Triglycerides: 135 mg/dL (ref 0–149)
VLDL Cholesterol Cal: 24 mg/dL (ref 5–40)

## 2019-10-06 LAB — CMP14+EGFR
ALT: 20 IU/L (ref 0–44)
AST: 17 IU/L (ref 0–40)
Albumin/Globulin Ratio: 1.8 (ref 1.2–2.2)
Albumin: 4.2 g/dL (ref 4.0–5.0)
Alkaline Phosphatase: 106 IU/L (ref 44–121)
BUN/Creatinine Ratio: 12 (ref 9–20)
BUN: 11 mg/dL (ref 6–20)
Bilirubin Total: 0.4 mg/dL (ref 0.0–1.2)
CO2: 21 mmol/L (ref 20–29)
Calcium: 9.2 mg/dL (ref 8.7–10.2)
Chloride: 105 mmol/L (ref 96–106)
Creatinine, Ser: 0.91 mg/dL (ref 0.76–1.27)
GFR calc Af Amer: 122 mL/min/{1.73_m2} (ref >59)
GFR calc non Af Amer: 106 mL/min/{1.73_m2} (ref >59)
Globulin, Total: 2.3 g/dL (ref 1.5–4.5)
Glucose: 92 mg/dL (ref 65–99)
Potassium: 4 mmol/L (ref 3.5–5.2)
Sodium: 140 mmol/L (ref 134–144)
Total Protein: 6.5 g/dL (ref 6.0–8.5)

## 2019-10-06 LAB — CBC WITH DIFFERENTIAL/PLATELET
Basophils Absolute: 0.1 10*3/uL (ref 0.0–0.2)
Basos: 1 %
EOS (ABSOLUTE): 0.4 10*3/uL (ref 0.0–0.4)
Eos: 5 %
Hematocrit: 47.1 % (ref 37.5–51.0)
Hemoglobin: 16.4 g/dL (ref 13.0–17.7)
Immature Grans (Abs): 0 10*3/uL (ref 0.0–0.1)
Immature Granulocytes: 0 %
Lymphocytes Absolute: 3.1 10*3/uL (ref 0.7–3.1)
Lymphs: 33 %
MCH: 28.4 pg (ref 26.6–33.0)
MCHC: 34.8 g/dL (ref 31.5–35.7)
MCV: 82 fL (ref 79–97)
Monocytes Absolute: 0.8 10*3/uL (ref 0.1–0.9)
Monocytes: 9 %
Neutrophils Absolute: 4.9 10*3/uL (ref 1.4–7.0)
Neutrophils: 52 %
Platelets: 258 10*3/uL (ref 150–450)
RBC: 5.77 x10E6/uL (ref 4.14–5.80)
RDW: 13 % (ref 11.6–15.4)
WBC: 9.3 10*3/uL (ref 3.4–10.8)

## 2019-10-08 ENCOUNTER — Ambulatory Visit (INDEPENDENT_AMBULATORY_CARE_PROVIDER_SITE_OTHER): Payer: BC Managed Care – PPO | Admitting: Emergency Medicine

## 2019-10-08 ENCOUNTER — Encounter: Payer: Self-pay | Admitting: Emergency Medicine

## 2019-10-08 ENCOUNTER — Other Ambulatory Visit: Payer: Self-pay

## 2019-10-08 VITALS — BP 98/64 | HR 63 | Temp 98.3°F | Resp 16 | Ht 73.0 in | Wt 223.0 lb

## 2019-10-08 DIAGNOSIS — E785 Hyperlipidemia, unspecified: Secondary | ICD-10-CM

## 2019-10-08 DIAGNOSIS — Z0001 Encounter for general adult medical examination with abnormal findings: Secondary | ICD-10-CM | POA: Diagnosis not present

## 2019-10-08 DIAGNOSIS — Z716 Tobacco abuse counseling: Secondary | ICD-10-CM

## 2019-10-08 DIAGNOSIS — F17209 Nicotine dependence, unspecified, with unspecified nicotine-induced disorders: Secondary | ICD-10-CM | POA: Diagnosis not present

## 2019-10-08 MED ORDER — BUPROPION HCL ER (SR) 150 MG PO TB12: 150.0000 mg | ORAL_TABLET | Freq: Every day | ORAL | 1 refills | Status: DC

## 2019-10-08 NOTE — Progress Notes (Signed)
Shawn Logan 39 y.o.   Chief Complaint  Patient presents with  . Annual Exam    HISTORY OF PRESENT ILLNESS: This is a 39 y.o. male here for annual exam. Self health grade: B or C due to poor eating habits and smoking. Has history of dyslipidemia on Crestor 10 mg daily. Had episode of depression last year.  Off medication now and much improved. Smoker with desire to quit. No other complaints or medical concerns today.  HPI   Prior to Admission medications   Medication Sig Start Date End Date Taking? Authorizing Provider  rosuvastatin (CRESTOR) 10 MG tablet Take 1 tablet (10 mg total) by mouth daily. 03/21/19  Yes SagardiaEilleen Kempf, MD    No Known Allergies  Patient Active Problem List   Diagnosis Date Noted  . Moderate episode of recurrent major depressive disorder (HCC) 09/18/2018  . Alcohol use   . Dyslipidemia   . Tobacco abuse 04/09/2015    Past Medical History:  Diagnosis Date  . Anxiety   . Depression     History reviewed. No pertinent surgical history.  Social History   Socioeconomic History  . Marital status: Married    Spouse name: [place]  . Number of children: 1  . Years of education: Not on file  . Highest education level: Not on file  Occupational History  . Not on file  Tobacco Use  . Smoking status: Current Every Day Smoker    Packs/day: 1.50    Types: Cigarettes  . Smokeless tobacco: Never Used  Vaping Use  . Vaping Use: Never used  Substance and Sexual Activity  . Alcohol use: Yes    Alcohol/week: 12.0 standard drinks    Types: 12 Cans of beer per week    Comment: weekend   . Drug use: No  . Sexual activity: Yes  Other Topics Concern  . Not on file  Social History Narrative  . Not on file   Social Determinants of Health   Financial Resource Strain:   . Difficulty of Paying Living Expenses: Not on file  Food Insecurity:   . Worried About Programme researcher, broadcasting/film/video in the Last Year: Not on file  . Ran Out of Food in the Last Year:  Not on file  Transportation Needs:   . Lack of Transportation (Medical): Not on file  . Lack of Transportation (Non-Medical): Not on file  Physical Activity:   . Days of Exercise per Week: Not on file  . Minutes of Exercise per Session: Not on file  Stress:   . Feeling of Stress : Not on file  Social Connections:   . Frequency of Communication with Friends and Family: Not on file  . Frequency of Social Gatherings with Friends and Family: Not on file  . Attends Religious Services: Not on file  . Active Member of Clubs or Organizations: Not on file  . Attends Banker Meetings: Not on file  . Marital Status: Not on file  Intimate Partner Violence:   . Fear of Current or Ex-Partner: Not on file  . Emotionally Abused: Not on file  . Physically Abused: Not on file  . Sexually Abused: Not on file    Family History  Problem Relation Age of Onset  . Alcohol abuse Father   . Drug abuse Sister        died 6 weeks ago  . Cancer Maternal Grandmother   . Cancer Maternal Grandfather        lung  Review of Systems  Constitutional: Negative.  Negative for chills, fever, malaise/fatigue and weight loss.  HENT: Negative.  Negative for congestion and sore throat.   Respiratory: Negative.  Negative for cough and shortness of breath.   Cardiovascular: Negative.  Negative for chest pain and palpitations.  Gastrointestinal: Negative.  Negative for abdominal pain, blood in stool, constipation, diarrhea, melena, nausea and vomiting.  Genitourinary: Negative.  Negative for dysuria and hematuria.  Musculoskeletal: Negative.  Negative for back pain, myalgias and neck pain.  Skin: Negative.  Negative for rash.  Neurological: Negative.  Negative for dizziness and headaches.  All other systems reviewed and are negative.  Vitals:   10/08/19 1606  BP: 98/64  Pulse: 63  Resp: 16  Temp: 98.3 F (36.8 C)  SpO2: 95%   Wt Readings from Last 3 Encounters:  10/08/19 223 lb (101.2 kg)    08/14/19 (!) 219 lb (99.3 kg)  03/19/19 230 lb (104.3 kg)     Physical Exam Vitals reviewed.  Constitutional:      Appearance: Normal appearance.  HENT:     Head: Normocephalic.  Eyes:     Extraocular Movements: Extraocular movements intact.     Conjunctiva/sclera: Conjunctivae normal.     Pupils: Pupils are equal, round, and reactive to light.  Cardiovascular:     Rate and Rhythm: Normal rate and regular rhythm.     Pulses: Normal pulses.     Heart sounds: Normal heart sounds.  Pulmonary:     Effort: Pulmonary effort is normal.     Breath sounds: Normal breath sounds.  Abdominal:     General: Bowel sounds are normal. There is no distension.     Palpations: Abdomen is soft.     Tenderness: There is no abdominal tenderness.  Musculoskeletal:        General: Normal range of motion.     Cervical back: Normal range of motion and neck supple.  Skin:    General: Skin is warm and dry.     Capillary Refill: Capillary refill takes less than 2 seconds.  Neurological:     General: No focal deficit present.     Mental Status: He is alert and oriented to person, place, and time.  Psychiatric:        Mood and Affect: Mood normal.        Behavior: Behavior normal.      ASSESSMENT & PLAN: Shawn Logan was seen today for annual exam.  Diagnoses and all orders for this visit:  Encounter for general adult medical examination with abnormal findings  Tobacco use disorder, continuous -     Ambulatory referral to Smoking Cessation Program -     buPROPion (WELLBUTRIN SR) 150 MG 12 hr tablet; Take 1 tablet (150 mg total) by mouth daily.  Encounter for smoking cessation counseling  Dyslipidemia    Patient Instructions       If you have lab work done today you will be contacted with your lab results within the next 2 weeks.  If you have not heard from [place] then please contact us. The fastest way to get your results is to register for My Chart.   IF you received an x-ray today, you  will receive an invoice from [facility]. Please contact [facility] at [phone] with questions or concerns regarding your invoice.   IF you received labwork today, you will receive an invoice from [facility]. Please contact [facility] at [phone] with questions or concerns regarding your invoice.   Our billing  staff will not be able to assist you with questions regarding bills from these companies.  You will be contacted with the lab results as soon as they are available. The fastest way to get your results is to activate your My Chart account. Instructions are located on the last page of this paperwork. If you have not heard from [place] regarding the results in 2 weeks, please contact this office.      Health Maintenance, Male Adopting a healthy lifestyle and getting preventive care are important in promoting health and wellness. Ask your health care provider about:  The right schedule for you to have regular tests and exams.  Things you can do on your own to prevent diseases and keep yourself healthy. What should I know about diet, weight, and exercise? Eat a healthy diet   Eat a diet that includes plenty of vegetables, fruits, low-fat dairy products, and lean protein.  Do not eat a lot of foods that are high in solid fats, added sugars, or sodium. Maintain a healthy weight Body mass index (BMI) is a measurement that can be used to identify possible weight problems. It estimates body fat based on height and weight. Your health care provider can help determine your BMI and help you achieve or maintain a healthy weight. Get regular exercise Get regular exercise. This is one of the most important things you can do for your health. Most adults should:  Exercise for at least 150 minutes each week. The exercise should increase your heart rate and make you sweat (moderate-intensity exercise).  Do strengthening exercises at least twice a week. This is in addition to the  moderate-intensity exercise.  Spend less time sitting. Even light physical activity can be beneficial. Watch cholesterol and blood lipids Have your blood tested for lipids and cholesterol at 39 years of age, then have this test every 5 years. You may need to have your cholesterol levels checked more often if:  Your lipid or cholesterol levels are high.  You are older than 39 years of age.  You are at high risk for heart disease. What should I know about cancer screening? Many types of cancers can be detected early and may often be prevented. Depending on your health history and family history, you may need to have cancer screening at various ages. This may include screening for:  Colorectal cancer.  Prostate cancer.  Skin cancer.  Lung cancer. What should I know about heart disease, diabetes, and high blood pressure? Blood pressure and heart disease  High blood pressure causes heart disease and increases the risk of stroke. This is more likely to develop in people who have high blood pressure readings, are of African descent, or are overweight.  Talk with your health care provider about your target blood pressure readings.  Have your blood pressure checked: ? Every 3-5 years if you are 40-40 years of age. ? Every year if you are 26 years old or older.  If you are between the ages of 34 and 52 and are a current or former smoker, ask your health care provider if you should have a one-time screening for abdominal aortic aneurysm (AAA). Diabetes Have regular diabetes screenings. This checks your fasting blood sugar level. Have the screening done:  Once every three years after age 38 if you are at a normal weight and have a low risk for diabetes.  More often and at a younger age if you are overweight or have a high risk for diabetes. What  should I know about preventing infection? Hepatitis B If you have a higher risk for hepatitis B, you should be screened for this virus. Talk  with your health care provider to find out if you are at risk for hepatitis B infection. Hepatitis C Blood testing is recommended for:  Everyone born from [birth year] through 1965.  Anyone with known risk factors for hepatitis C. Sexually transmitted infections (STIs)  You should be screened each year for STIs, including gonorrhea and chlamydia, if: ? You are sexually active and are younger than 39 years of age. ? You are older than 39 years of age and your health care provider tells you that you are at risk for this type of infection. ? Your sexual activity has changed since you were last screened, and you are at increased risk for chlamydia or gonorrhea. Ask your health care provider if you are at risk.  Ask your health care provider about whether you are at high risk for HIV. Your health care provider may recommend a prescription medicine to help prevent HIV infection. If you choose to take medicine to prevent HIV, you should first get tested for HIV. You should then be tested every 3 months for as long as you are taking the medicine. Follow these instructions at home: Lifestyle  Do not use any products that contain nicotine or tobacco, such as cigarettes, e-cigarettes, and chewing tobacco. If you need help quitting, ask your health care provider.  Do not use street drugs.  Do not share needles.  Ask your health care provider for help if you need support or information about quitting drugs. Alcohol use  Do not drink alcohol if your health care provider tells you not to drink.  If you drink alcohol: ? Limit how much you have to 0-2 drinks a day. ? Be aware of how much alcohol is in your drink. In the [place] one drink equals one 12 oz bottle of beer (355 mL), one 5 oz glass of wine (148 mL), or one 1 oz glass of hard liquor (44 mL). General instructions  Schedule regular health, dental, and eye exams.  Stay current with your vaccines.  Tell your health care provider if: ? You often  feel depressed. ? You have ever been abused or do not feel safe at home. Summary  Adopting a healthy lifestyle and getting preventive care are important in promoting health and wellness.  Follow your health care provider's instructions about healthy diet, exercising, and getting tested or screened for diseases.  Follow your health care provider's instructions on monitoring your cholesterol and blood pressure. This information is not intended to replace advice given to you by your health care provider. Make sure you discuss any questions you have with your health care provider. Document Revised: 01/01/2018 Document Reviewed: 01/01/2018 Elsevier Patient Education  2020 [employer].  Steps to Quit Smoking Smoking tobacco is the leading cause of preventable death. It can affect almost every organ in the body. Smoking puts you and people around you at risk for many serious, long-lasting (chronic) diseases. Quitting smoking can be hard, but it is one of the best things that you can do for your health. It is never too late to quit. How do I get ready to quit? When you decide to quit smoking, make a plan to help you succeed. Before you quit:  Pick a date to quit. Set a date within the next 2 weeks to give you time to prepare.  Write down the reasons  why you are quitting. Keep this list in places where you will see it often.  Tell your family, friends, and co-workers that you are quitting. Their support is important.  Talk with your doctor about the choices that may help you quit.  Find out if your health insurance will pay for these treatments.  Know the people, places, things, and activities that make you want to smoke (triggers). Avoid them. What first steps can I take to quit smoking?  Throw away all cigarettes at home, at work, and in your car.  Throw away the things that you use when you smoke, such as ashtrays and lighters.  Clean your car. Make sure to empty the ashtray.  Clean  your home, including curtains and carpets. What can I do to help me quit smoking? Talk with your doctor about taking medicines and seeing a counselor at the same time. You are more likely to succeed when you do both.  If you are pregnant or breastfeeding, talk with your doctor about counseling or other ways to quit smoking. Do not take medicine to help you quit smoking unless your doctor tells you to do so. To quit smoking: Quit right away  Quit smoking totally, instead of slowly cutting back on how much you smoke over a period of time.  Go to counseling. You are more likely to quit if you go to counseling sessions regularly. Take medicine You may take medicines to help you quit. Some medicines need a prescription, and some you can buy over-the-counter. Some medicines may contain a drug called nicotine to replace the nicotine in cigarettes. Medicines may:  Help you to stop having the desire to smoke (cravings).  Help to stop the problems that come when you stop smoking (withdrawal symptoms). Your doctor may ask you to use:  Nicotine patches, gum, or lozenges.  Nicotine inhalers or sprays.  Non-nicotine medicine that is taken by mouth. Find resources Find resources and other ways to help you quit smoking and remain smoke-free after you quit. These resources are most helpful when you use them often. They include:  Online chats with a Veterinary surgeon.  Phone quitlines.  Printed Materials engineer.  Support groups or group counseling.  Text messaging programs.  Mobile phone apps. Use apps on your mobile phone or tablet that can help you stick to your quit plan. There are many free apps for mobile phones and tablets as well as websites. Examples include Quit Guide from the [employer] and smokefree.gov  What things can I do to make it easier to quit?   Talk to your family and friends. Ask them to support and encourage you.  Call a phone quitline (1-800-QUIT-NOW), reach out to support  groups, or work with a Veterinary surgeon.  Ask people who smoke to not smoke around you.  Avoid places that make you want to smoke, such as: ? Bars. ? Parties. ? Smoke-break areas at work.  Spend time with people who do not smoke.  Lower the stress in your life. Stress can make you want to smoke. Try these things to help your stress: ? Getting regular exercise. ? Doing deep-breathing exercises. ? Doing yoga. ? Meditating. ? Doing a body scan. To do this, close your eyes, focus on one area of your body at a time from head to toe. Notice which parts of your body are tense. Try to relax the muscles in those areas. How will I feel when I quit smoking? Day 1 to 3 weeks Within the first  24 hours, you may start to have some problems that come from quitting tobacco. These problems are very bad 2-3 days after you quit, but they do not often last for more than 2-3 weeks. You may get these symptoms:  Mood swings.  Feeling restless, nervous, angry, or annoyed.  Trouble concentrating.  Dizziness.  Strong desire for high-sugar foods and nicotine.  Weight gain.  Trouble pooping (constipation).  Feeling like you may vomit (nausea).  Coughing or a sore throat.  Changes in how the medicines that you take for other issues work in your body.  Depression.  Trouble sleeping (insomnia). Week 3 and afterward After the first 2-3 weeks of quitting, you may start to notice more positive results, such as:  Better sense of smell and taste.  Less coughing and sore throat.  Slower heart rate.  Lower blood pressure.  Clearer skin.  Better breathing.  Fewer sick days. Quitting smoking can be hard. Do not give up if you fail the first time. Some people need to try a few times before they succeed. Do your best to stick to your quit plan, and talk with your doctor if you have any questions or concerns. Summary  Smoking tobacco is the leading cause of preventable death. Quitting smoking can be  hard, but it is one of the best things that you can do for your health.  When you decide to quit smoking, make a plan to help you succeed.  Quit smoking right away, not slowly over a period of time.  When you start quitting, seek help from your doctor, family, or friends. This information is not intended to replace advice given to you by your health care provider. Make sure you discuss any questions you have with your health care provider. Document Revised: 10/03/2018 Document Reviewed: 03/29/2018 Elsevier Patient Education  2020 Elsevier Inc.      Edwina BarthMiguel Channa Hazelett, MD [facility]

## 2019-10-08 NOTE — Patient Instructions (Addendum)
   If you have lab work done today you will be contacted with your lab results within the next 2 weeks.  If you have not heard from us then please contact us. The fastest way to get your results is to register for My Chart.   IF you received an x-ray today, you will receive an invoice from [facility]. Please contact [facility] at [phone] with questions or concerns regarding your invoice.   IF you received labwork today, you will receive an invoice from [facility]. Please contact [facility] at [phone] with questions or concerns regarding your invoice.   Our billing staff will not be able to assist you with questions regarding bills from these companies.  You will be contacted with the lab results as soon as they are available. The fastest way to get your results is to activate your My Chart account. Instructions are located on the last page of this paperwork. If you have not heard from us regarding the results in 2 weeks, please contact this office.      Health Maintenance, Male Adopting a healthy lifestyle and getting preventive care are important in promoting health and wellness. Ask your health care provider about:  The right schedule for you to have regular tests and exams.  Things you can do on your own to prevent diseases and keep yourself healthy. What should I know about diet, weight, and exercise? Eat a healthy diet   Eat a diet that includes plenty of vegetables, fruits, low-fat dairy products, and lean protein.  Do not eat a lot of foods that are high in solid fats, added sugars, or sodium. Maintain a healthy weight Body mass index (BMI) is a measurement that can be used to identify possible weight problems. It estimates body fat based on height and weight. Your health care provider can help determine your BMI and help you achieve or maintain a healthy weight. Get regular exercise Get regular exercise. This is one of the most important things you  can do for your health. Most adults should:  Exercise for at least 150 minutes each week. The exercise should increase your heart rate and make you sweat (moderate-intensity exercise).  Do strengthening exercises at least twice a week. This is in addition to the moderate-intensity exercise.  Spend less time sitting. Even light physical activity can be beneficial. Watch cholesterol and blood lipids Have your blood tested for lipids and cholesterol at 39 years of age, then have this test every 5 years. You may need to have your cholesterol levels checked more often if:  Your lipid or cholesterol levels are high.  You are older than 40 years of age.  You are at high risk for heart disease. What should I know about cancer screening? Many types of cancers can be detected early and may often be prevented. Depending on your health history and family history, you may need to have cancer screening at various ages. This may include screening for:  Colorectal cancer.  Prostate cancer.  Skin cancer.  Lung cancer. What should I know about heart disease, diabetes, and high blood pressure? Blood pressure and heart disease  High blood pressure causes heart disease and increases the risk of stroke. This is more likely to develop in people who have high blood pressure readings, are of African descent, or are overweight.  Talk with your health care provider about your target blood pressure readings.  Have your blood pressure checked: ? Every 3-5 years if you are 18-39   years of age. ? Every year if you are 29 years old or older.  If you are between the ages of 56 and 7 and are a current or former smoker, ask your health care provider if you should have a one-time screening for abdominal aortic aneurysm (AAA). Diabetes Have regular diabetes screenings. This checks your fasting blood sugar level. Have the screening done:  Once every three years after age 71 if you are at a normal weight and have  a low risk for diabetes.  More often and at a younger age if you are overweight or have a high risk for diabetes. What should I know about preventing infection? Hepatitis B If you have a higher risk for hepatitis B, you should be screened for this virus. Talk with your health care provider to find out if you are at risk for hepatitis B infection. Hepatitis C Blood testing is recommended for:  Everyone born from [birth year] through 1965.  Anyone with known risk factors for hepatitis C. Sexually transmitted infections (STIs)  You should be screened each year for STIs, including gonorrhea and chlamydia, if: ? You are sexually active and are younger than 39 years of age. ? You are older than 39 years of age and your health care provider tells you that you are at risk for this type of infection. ? Your sexual activity has changed since you were last screened, and you are at increased risk for chlamydia or gonorrhea. Ask your health care provider if you are at risk.  Ask your health care provider about whether you are at high risk for HIV. Your health care provider may recommend a prescription medicine to help prevent HIV infection. If you choose to take medicine to prevent HIV, you should first get tested for HIV. You should then be tested every 3 months for as long as you are taking the medicine. Follow these instructions at home: Lifestyle  Do not use any products that contain nicotine or tobacco, such as cigarettes, e-cigarettes, and chewing tobacco. If you need help quitting, ask your health care provider.  Do not use street drugs.  Do not share needles.  Ask your health care provider for help if you need support or information about quitting drugs. Alcohol use  Do not drink alcohol if your health care provider tells you not to drink.  If you drink alcohol: ? Limit how much you have to 0-2 drinks a day. ? Be aware of how much alcohol is in your drink. In the [place] one drink equals one 12  oz bottle of beer (355 mL), one 5 oz glass of wine (148 mL), or one 1 oz glass of hard liquor (44 mL). General instructions  Schedule regular health, dental, and eye exams.  Stay current with your vaccines.  Tell your health care provider if: ? You often feel depressed. ? You have ever been abused or do not feel safe at home. Summary  Adopting a healthy lifestyle and getting preventive care are important in promoting health and wellness.  Follow your health care provider's instructions about healthy diet, exercising, and getting tested or screened for diseases.  Follow your health care provider's instructions on monitoring your cholesterol and blood pressure. This information is not intended to replace advice given to you by your health care provider. Make sure you discuss any questions you have with your health care provider. Document Revised: 01/01/2018 Document Reviewed: 01/01/2018 Elsevier Patient Education  2020 [employer].  Steps to Quit Smoking Smoking  tobacco is the leading cause of preventable death. It can affect almost every organ in the body. Smoking puts you and people around you at risk for many serious, long-lasting (chronic) diseases. Quitting smoking can be hard, but it is one of the best things that you can do for your health. It is never too late to quit. How do I get ready to quit? When you decide to quit smoking, make a plan to help you succeed. Before you quit:  Pick a date to quit. Set a date within the next 2 weeks to give you time to prepare.  Write down the reasons why you are quitting. Keep this list in places where you will see it often.  Tell your family, friends, and co-workers that you are quitting. Their support is important.  Talk with your doctor about the choices that may help you quit.  Find out if your health insurance will pay for these treatments.  Know the people, places, things, and activities that make you want to smoke (triggers).  Avoid them. What first steps can I take to quit smoking?  Throw away all cigarettes at home, at work, and in your car.  Throw away the things that you use when you smoke, such as ashtrays and lighters.  Clean your car. Make sure to empty the ashtray.  Clean your home, including curtains and carpets. What can I do to help me quit smoking? Talk with your doctor about taking medicines and seeing a counselor at the same time. You are more likely to succeed when you do both.  If you are pregnant or breastfeeding, talk with your doctor about counseling or other ways to quit smoking. Do not take medicine to help you quit smoking unless your doctor tells you to do so. To quit smoking: Quit right away  Quit smoking totally, instead of slowly cutting back on how much you smoke over a period of time.  Go to counseling. You are more likely to quit if you go to counseling sessions regularly. Take medicine You may take medicines to help you quit. Some medicines need a prescription, and some you can buy over-the-counter. Some medicines may contain a drug called nicotine to replace the nicotine in cigarettes. Medicines may:  Help you to stop having the desire to smoke (cravings).  Help to stop the problems that come when you stop smoking (withdrawal symptoms). Your doctor may ask you to use:  Nicotine patches, gum, or lozenges.  Nicotine inhalers or sprays.  Non-nicotine medicine that is taken by mouth. Find resources Find resources and other ways to help you quit smoking and remain smoke-free after you quit. These resources are most helpful when you use them often. They include:  Online chats with a counselor.  Phone quitlines.  Printed self-help materials.  Support groups or group counseling.  Text messaging programs.  Mobile phone apps. Use apps on your mobile phone or tablet that can help you stick to your quit plan. There are many free apps for mobile phones and tablets as well as  websites. Examples include Quit Guide from the CDC and smokefree.gov  What things can I do to make it easier to quit?   Talk to your family and friends. Ask them to support and encourage you.  Call a phone quitline (1-800-QUIT-NOW), reach out to support groups, or work with a counselor.  Ask people who smoke to not smoke around you.  Avoid places that make you want to smoke, such as: ? Bars. ?   Parties. ? Smoke-break areas at work.  Spend time with people who do not smoke.  Lower the stress in your life. Stress can make you want to smoke. Try these things to help your stress: ? Getting regular exercise. ? Doing deep-breathing exercises. ? Doing yoga. ? Meditating. ? Doing a body scan. To do this, close your eyes, focus on one area of your body at a time from head to toe. Notice which parts of your body are tense. Try to relax the muscles in those areas. How will I feel when I quit smoking? Day 1 to 3 weeks Within the first 24 hours, you may start to have some problems that come from quitting tobacco. These problems are very bad 2-3 days after you quit, but they do not often last for more than 2-3 weeks. You may get these symptoms:  Mood swings.  Feeling restless, nervous, angry, or annoyed.  Trouble concentrating.  Dizziness.  Strong desire for high-sugar foods and nicotine.  Weight gain.  Trouble pooping (constipation).  Feeling like you may vomit (nausea).  Coughing or a sore throat.  Changes in how the medicines that you take for other issues work in your body.  Depression.  Trouble sleeping (insomnia). Week 3 and afterward After the first 2-3 weeks of quitting, you may start to notice more positive results, such as:  Better sense of smell and taste.  Less coughing and sore throat.  Slower heart rate.  Lower blood pressure.  Clearer skin.  Better breathing.  Fewer sick days. Quitting smoking can be hard. Do not give up if you fail the first time.  Some people need to try a few times before they succeed. Do your best to stick to your quit plan, and talk with your doctor if you have any questions or concerns. Summary  Smoking tobacco is the leading cause of preventable death. Quitting smoking can be hard, but it is one of the best things that you can do for your health.  When you decide to quit smoking, make a plan to help you succeed.  Quit smoking right away, not slowly over a period of time.  When you start quitting, seek help from your doctor, family, or friends. This information is not intended to replace advice given to you by your health care provider. Make sure you discuss any questions you have with your health care provider. Document Revised: 10/03/2018 Document Reviewed: 03/29/2018 Elsevier Patient Education  2020 [employer].

## 2020-03-11 ENCOUNTER — Ambulatory Visit: Payer: BC Managed Care – PPO | Admitting: Registered Nurse

## 2020-05-30 ENCOUNTER — Other Ambulatory Visit: Payer: Self-pay | Admitting: Emergency Medicine

## 2020-05-30 DIAGNOSIS — E785 Hyperlipidemia, unspecified: Secondary | ICD-10-CM

## 2020-06-07 ENCOUNTER — Other Ambulatory Visit: Payer: Self-pay | Admitting: Registered Nurse

## 2020-06-07 DIAGNOSIS — F411 Generalized anxiety disorder: Secondary | ICD-10-CM

## 2020-06-07 DIAGNOSIS — F331 Major depressive disorder, recurrent, moderate: Secondary | ICD-10-CM

## 2020-06-26 ENCOUNTER — Other Ambulatory Visit: Payer: Self-pay | Admitting: Registered Nurse

## 2020-06-26 DIAGNOSIS — F331 Major depressive disorder, recurrent, moderate: Secondary | ICD-10-CM

## 2020-06-26 DIAGNOSIS — F411 Generalized anxiety disorder: Secondary | ICD-10-CM

## 2020-06-28 ENCOUNTER — Other Ambulatory Visit: Payer: Self-pay | Admitting: Emergency Medicine

## 2020-06-28 DIAGNOSIS — F17209 Nicotine dependence, unspecified, with unspecified nicotine-induced disorders: Secondary | ICD-10-CM

## 2020-06-28 NOTE — Telephone Encounter (Signed)
1.Medication Requested: buPROPion (WELLBUTRIN SR) 150 MG 12 hr tablet  rosuvastatin (CRESTOR) 10 MG tablet    2. Pharmacy (Name, Street, [place]): [id number] - [place], [place] - [address]  3. On Med List: yes   4. Last Visit with PCP: 10-08-19  5. Next visit date with PCP: 10-10-20   Agent: Please be advised that RX refills may take up to 3 business days. We ask that you follow-up with your pharmacy.

## 2020-07-05 ENCOUNTER — Other Ambulatory Visit: Payer: Self-pay | Admitting: *Deleted

## 2020-07-05 DIAGNOSIS — F17209 Nicotine dependence, unspecified, with unspecified nicotine-induced disorders: Secondary | ICD-10-CM

## 2020-07-05 DIAGNOSIS — E785 Hyperlipidemia, unspecified: Secondary | ICD-10-CM

## 2020-07-05 MED ORDER — ROSUVASTATIN CALCIUM 10 MG PO TABS: 10.0000 mg | ORAL_TABLET | Freq: Every day | ORAL | 0 refills | Status: DC

## 2020-07-06 NOTE — Telephone Encounter (Signed)
Rosuvastatin refilled on 07/05/2020, unable to refill Wellbutrin because of protocol.

## 2020-07-07 NOTE — Telephone Encounter (Signed)
LVM instructing pt to call office to clarify request.  Medication appears to be d/c'd on 01/06/20.  Pt has upcoming appt on 07/12/20 & can discuss with PCP at that time.

## 2020-07-12 ENCOUNTER — Other Ambulatory Visit: Payer: Self-pay

## 2020-07-12 ENCOUNTER — Encounter: Payer: Self-pay | Admitting: Emergency Medicine

## 2020-07-12 ENCOUNTER — Ambulatory Visit: Payer: BC Managed Care – PPO | Admitting: Emergency Medicine

## 2020-07-12 VITALS — BP 104/80 | HR 73 | Temp 98.5°F | Ht 73.0 in | Wt 226.6 lb

## 2020-07-12 DIAGNOSIS — F17200 Nicotine dependence, unspecified, uncomplicated: Secondary | ICD-10-CM

## 2020-07-12 DIAGNOSIS — F32A Depression, unspecified: Secondary | ICD-10-CM

## 2020-07-12 DIAGNOSIS — E785 Hyperlipidemia, unspecified: Secondary | ICD-10-CM

## 2020-07-12 MED ORDER — ROSUVASTATIN CALCIUM 10 MG PO TABS: 10.0000 mg | ORAL_TABLET | Freq: Every day | ORAL | 3 refills | Status: DC

## 2020-07-12 MED ORDER — SERTRALINE HCL 50 MG PO TABS: 50.0000 mg | ORAL_TABLET | Freq: Every day | ORAL | 3 refills | Status: DC

## 2020-07-12 NOTE — Assessment & Plan Note (Signed)
Diet and nutrition discussed. Continue rosuvastatin 10 mg daily.  

## 2020-07-12 NOTE — Assessment & Plan Note (Signed)
Cardiovascular and cancer risk factors discussed with patient. Smoking cessation advice given.

## 2020-07-12 NOTE — Assessment & Plan Note (Signed)
Was doing well on Zoloft 50 mg daily and wants to restart medication.

## 2020-07-12 NOTE — Progress Notes (Signed)
Shawn Logan 40 y.o.   Chief Complaint  Patient presents with   Medication Refill    Rosuvastatin and Zoloft    HISTORY OF PRESENT ILLNESS: This is a 40 y.o. male with history of dyslipidemia and chronic depression here for follow-up and medication refill. Presently taking rosuvastatin 10 mg daily.  Was taking Zoloft 50 mg daily but ran out 2 weeks ago. Chronic smoker about 1 pack/day. Has no complaints or medical concerns today.  Medication Refill Pertinent negatives include no abdominal pain, chest pain, chills, congestion, coughing, fever, headaches, nausea, rash, sore throat or vomiting.    Prior to Admission medications   Medication Sig Start Date End Date Taking? Authorizing Provider  buPROPion (WELLBUTRIN SR) 150 MG 12 hr tablet Take 1 tablet (150 mg total) by mouth daily. 10/08/19 01/06/20  Georgina Quint, MD  rosuvastatin (CRESTOR) 10 MG tablet Take 1 tablet (10 mg total) by mouth daily. 07/12/20   Georgina Quint, MD  sertraline (ZOLOFT) 50 MG tablet Take 1 tablet (50 mg total) by mouth daily. 07/12/20   Georgina Quint, MD    Not on File  Patient Active Problem List   Diagnosis Date Noted   Chronic depression 05/24/2017   Alcohol use    Dyslipidemia    Tobacco abuse 04/09/2015    Past Medical History:  Diagnosis Date   Anxiety    Depression     No past surgical history on file.  Social History   Socioeconomic History   Marital status: Married    Spouse name: Jeanice Lim   Number of children: 1   Years of education: Not on file   Highest education level: Not on file  Occupational History   Not on file  Tobacco Use   Smoking status: Every Day    Packs/day: 1.50    Pack years: 0.00    Types: Cigarettes   Smokeless tobacco: Never  Vaping Use   Vaping Use: Never used  Substance and Sexual Activity   Alcohol use: Yes    Alcohol/week: 12.0 standard drinks    Types: 12 Cans of beer per week    Comment: weekend    Drug use: No   Sexual  activity: Yes  Other Topics Concern   Not on file  Social History Narrative   Not on file   Social Determinants of Health   Financial Resource Strain: Not on file  Food Insecurity: Not on file  Transportation Needs: Not on file  Physical Activity: Not on file  Stress: Not on file  Social Connections: Not on file  Intimate Partner Violence: Not on file    Family History  Problem Relation Age of Onset   Alcohol abuse Father    Drug abuse Sister        died 6 weeks ago   Cancer Maternal Grandmother    Cancer Maternal Grandfather        lung     Review of Systems  Constitutional: Negative.  Negative for chills and fever.  HENT: Negative.  Negative for congestion and sore throat.   Respiratory: Negative.  Negative for cough and shortness of breath.   Cardiovascular: Negative.  Negative for chest pain and palpitations.  Gastrointestinal:  Negative for abdominal pain, diarrhea, nausea and vomiting.  Genitourinary: Negative.  Negative for dysuria and hematuria.  Musculoskeletal: Negative.   Skin: Negative.  Negative for rash.  Neurological:  Negative for dizziness and headaches.  All other systems reviewed and are negative.  Today's Vitals  07/12/20 1547  BP: 104/80  Pulse: 73  Temp: 98.5 F (36.9 C)  TempSrc: Oral  SpO2: 96%  Weight: 226 lb 9.6 oz (102.8 kg)  Height: 6\' 1"  (1.854 m)   Body mass index is 29.9 kg/m.  Physical Exam Vitals reviewed.  Constitutional:      Appearance: Normal appearance.  HENT:     Head: Normocephalic.  Eyes:     Extraocular Movements: Extraocular movements intact.     Conjunctiva/sclera: Conjunctivae normal.     Pupils: Pupils are equal, round, and reactive to light.  Cardiovascular:     Rate and Rhythm: Normal rate and regular rhythm.     Pulses: Normal pulses.     Heart sounds: Normal heart sounds.  Pulmonary:     Effort: Pulmonary effort is normal.     Breath sounds: Normal breath sounds.  Musculoskeletal:         General: Normal range of motion.     Cervical back: Normal range of motion and neck supple.  Skin:    General: Skin is warm and dry.     Capillary Refill: Capillary refill takes less than 2 seconds.  Neurological:     General: No focal deficit present.     Mental Status: He is alert and oriented to person, place, and time.  Psychiatric:        Mood and Affect: Mood normal.        Behavior: Behavior normal.     ASSESSMENT & PLAN: A total of 30 was spent with the patient and counseling/coordination of care regarding cardiovascular and cancer risks associated with smoking, smoking cessation advice, dyslipidemia and management with diet and medication, education on nutrition, review of most recent office visit notes, review of all medications, chronic depression and treatment options including medication and psychiatry evaluation, health maintenance items, prognosis, documentation, and need for follow-up.  Chronic depression Was doing well on Zoloft 50 mg daily and wants to restart medication.  Current smoker Cardiovascular and cancer risk factors discussed with patient. Smoking cessation advice given.  Dyslipidemia Diet and nutrition discussed.  Continue rosuvastatin 10 mg daily. Shawn Logan was seen today for medication refill.  Diagnoses and all orders for this visit:  Dyslipidemia -     rosuvastatin (CRESTOR) 10 MG tablet; Take 1 tablet (10 mg total) by mouth daily.  Chronic depression -     sertraline (ZOLOFT) 50 MG tablet; Take 1 tablet (50 mg total) by mouth daily.  Current smoker  Patient Instructions  Health Maintenance, Male Adopting a healthy lifestyle and getting preventive care are important in promoting health and wellness. Ask your health care provider about: The right schedule for you to have regular tests and exams. Things you can do on your own to prevent diseases and keep yourself healthy. What should I know about diet, weight, and exercise? Eat a healthy  diet  Eat a diet that includes plenty of vegetables, fruits, low-fat dairy products, and lean protein. Do not eat a lot of foods that are high in solid fats, added sugars, or sodium.  Maintain a healthy weight Body mass index (BMI) is a measurement that can be used to identify possible weight problems. It estimates body fat based on height and weight. Your health care provider can help determine your BMI and help you achieve or maintain ahealthy weight. Get regular exercise Get regular exercise. This is one of the most important things you can do for your health. Most adults should: Exercise for at least 150 minutes  each week. The exercise should increase your heart rate and make you sweat (moderate-intensity exercise). Do strengthening exercises at least twice a week. This is in addition to the moderate-intensity exercise. Spend less time sitting. Even light physical activity can be beneficial. Watch cholesterol and blood lipids Have your blood tested for lipids and cholesterol at 40 years of age, then havethis test every 5 years. You may need to have your cholesterol levels checked more often if: Your lipid or cholesterol levels are high. You are older than 40 years of age. You are at high risk for heart disease. What should I know about cancer screening? Many types of cancers can be detected early and may often be prevented. Depending on your health history and family history, you may need to have cancer screening at various ages. This may include screening for: Colorectal cancer. Prostate cancer. Skin cancer. Lung cancer. What should I know about heart disease, diabetes, and high blood pressure? Blood pressure and heart disease High blood pressure causes heart disease and increases the risk of stroke. This is more likely to develop in people who have high blood pressure readings, are of African descent, or are overweight. Talk with your health care provider about your target blood  pressure readings. Have your blood pressure checked: Every 3-5 years if you are 80-83 years of age. Every year if you are 45 years old or older. If you are between the ages of 67 and 69 and are a current or former smoker, ask your health care provider if you should have a one-time screening for abdominal aortic aneurysm (AAA). Diabetes Have regular diabetes screenings. This checks your fasting blood sugar level. Have the screening done: Once every three years after age 73 if you are at a normal weight and have a low risk for diabetes. More often and at a younger age if you are overweight or have a high risk for diabetes. What should I know about preventing infection? Hepatitis B If you have a higher risk for hepatitis B, you should be screened for this virus. Talk with your health care provider to find out if you are at risk forhepatitis B infection. Hepatitis C Blood testing is recommended for: Everyone born from [birth year] through 1965. Anyone with known risk factors for hepatitis C. Sexually transmitted infections (STIs) You should be screened each year for STIs, including gonorrhea and chlamydia, if: You are sexually active and are younger than 40 years of age. You are older than 40 years of age and your health care provider tells you that you are at risk for this type of infection. Your sexual activity has changed since you were last screened, and you are at increased risk for chlamydia or gonorrhea. Ask your health care provider if you are at risk. Ask your health care provider about whether you are at high risk for HIV. Your health care provider may recommend a prescription medicine to help prevent HIV infection. If you choose to take medicine to prevent HIV, you should first get tested for HIV. You should then be tested every 3 months for as long as you are taking the medicine. Follow these instructions at home: Lifestyle Do not use any products that contain nicotine or tobacco, such as  cigarettes, e-cigarettes, and chewing tobacco. If you need help quitting, ask your health care provider. Do not use street drugs. Do not share needles. Ask your health care provider for help if you need support or information about quitting drugs. Alcohol use Do not  drink alcohol if your health care provider tells you not to drink. If you drink alcohol: Limit how much you have to 0-2 drinks a day. Be aware of how much alcohol is in your drink. In the [place] one drink equals one 12 oz bottle of beer (355 mL), one 5 oz glass of wine (148 mL), or one 1 oz glass of hard liquor (44 mL). General instructions Schedule regular health, dental, and eye exams. Stay current with your vaccines. Tell your health care provider if: You often feel depressed. You have ever been abused or do not feel safe at home. Summary Adopting a healthy lifestyle and getting preventive care are important in promoting health and wellness. Follow your health care provider's instructions about healthy diet, exercising, and getting tested or screened for diseases. Follow your health care provider's instructions on monitoring your cholesterol and blood pressure. This information is not intended to replace advice given to you by your health care provider. Make sure you discuss any questions you have with your healthcare provider. Document Revised: 01/01/2018 Document Reviewed: 01/01/2018 Elsevier Patient Education  2022 [employer] So  Edwina BarthMiguel Ninamarie Keel, MD [facility]

## 2020-07-12 NOTE — Patient Instructions (Signed)
Health Maintenance, Male Adopting a healthy lifestyle and getting preventive care are important in promoting health and wellness. Ask your health care provider about: The right schedule for you to have regular tests and exams. Things you can do on your own to prevent diseases and keep yourself healthy. What should I know about diet, weight, and exercise? Eat a healthy diet  Eat a diet that includes plenty of vegetables, fruits, low-fat dairy products, and lean protein. Do not eat a lot of foods that are high in solid fats, added sugars, or sodium.  Maintain a healthy weight Body mass index (BMI) is a measurement that can be used to identify possible weight problems. It estimates body fat based on height and weight. Your health care provider can help determine your BMI and help you achieve or maintain ahealthy weight. Get regular exercise Get regular exercise. This is one of the most important things you can do for your health. Most adults should: Exercise for at least 150 minutes each week. The exercise should increase your heart rate and make you sweat (moderate-intensity exercise). Do strengthening exercises at least twice a week. This is in addition to the moderate-intensity exercise. Spend less time sitting. Even light physical activity can be beneficial. Watch cholesterol and blood lipids Have your blood tested for lipids and cholesterol at 40 years of age, then havethis test every 5 years. You may need to have your cholesterol levels checked more often if: Your lipid or cholesterol levels are high. You are older than 40 years of age. You are at high risk for heart disease. What should I know about cancer screening? Many types of cancers can be detected early and may often be prevented. Depending on your health history and family history, you may need to have cancer screening at various ages. This may include screening for: Colorectal cancer. Prostate cancer. Skin cancer. Lung  cancer. What should I know about heart disease, diabetes, and high blood pressure? Blood pressure and heart disease High blood pressure causes heart disease and increases the risk of stroke. This is more likely to develop in people who have high blood pressure readings, are of African descent, or are overweight. Talk with your health care provider about your target blood pressure readings. Have your blood pressure checked: Every 3-5 years if you are 18-39 years of age. Every year if you are 40 years old or older. If you are between the ages of 65 and 75 and are a current or former smoker, ask your health care provider if you should have a one-time screening for abdominal aortic aneurysm (AAA). Diabetes Have regular diabetes screenings. This checks your fasting blood sugar level. Have the screening done: Once every three years after age 45 if you are at a normal weight and have a low risk for diabetes. More often and at a younger age if you are overweight or have a high risk for diabetes. What should I know about preventing infection? Hepatitis B If you have a higher risk for hepatitis B, you should be screened for this virus. Talk with your health care provider to find out if you are at risk forhepatitis B infection. Hepatitis C Blood testing is recommended for: Everyone born from 1945 through 1965. Anyone with known risk factors for hepatitis C. Sexually transmitted infections (STIs) You should be screened each year for STIs, including gonorrhea and chlamydia, if: You are sexually active and are younger than 40 years of age. You are older than 40 years of age   and your health care provider tells you that you are at risk for this type of infection. Your sexual activity has changed since you were last screened, and you are at increased risk for chlamydia or gonorrhea. Ask your health care provider if you are at risk. Ask your health care provider about whether you are at high risk for HIV.  Your health care provider may recommend a prescription medicine to help prevent HIV infection. If you choose to take medicine to prevent HIV, you should first get tested for HIV. You should then be tested every 3 months for as long as you are taking the medicine. Follow these instructions at home: Lifestyle Do not use any products that contain nicotine or tobacco, such as cigarettes, e-cigarettes, and chewing tobacco. If you need help quitting, ask your health care provider. Do not use street drugs. Do not share needles. Ask your health care provider for help if you need support or information about quitting drugs. Alcohol use Do not drink alcohol if your health care provider tells you not to drink. If you drink alcohol: Limit how much you have to 0-2 drinks a day. Be aware of how much alcohol is in your drink. In the [place] one drink equals one 12 oz bottle of beer (355 mL), one 5 oz glass of wine (148 mL), or one 1 oz glass of hard liquor (44 mL). General instructions Schedule regular health, dental, and eye exams. Stay current with your vaccines. Tell your health care provider if: You often feel depressed. You have ever been abused or do not feel safe at home. Summary Adopting a healthy lifestyle and getting preventive care are important in promoting health and wellness. Follow your health care provider's instructions about healthy diet, exercising, and getting tested or screened for diseases. Follow your health care provider's instructions on monitoring your cholesterol and blood pressure. This information is not intended to replace advice given to you by your health care provider. Make sure you discuss any questions you have with your healthcare provider. Document Revised: 01/01/2018 Document Reviewed: 01/01/2018 Elsevier Patient Education  2022 Elsevier Inc.  

## 2020-07-15 ENCOUNTER — Encounter (HOSPITAL_COMMUNITY): Payer: Self-pay

## 2020-07-15 ENCOUNTER — Ambulatory Visit (HOSPITAL_COMMUNITY)
Admission: EM | Admit: 2020-07-15 | Discharge: 2020-07-15 | Disposition: A | Discharge status: Home / Self Care | Payer: BC Managed Care – PPO

## 2020-07-15 DIAGNOSIS — R590 Localized enlarged lymph nodes: Secondary | ICD-10-CM | POA: Diagnosis not present

## 2020-07-15 NOTE — ED Triage Notes (Signed)
Pt in with c/o swelling, pain and redness under right arm that has been going on for 6 months  Pt states pain and swelling is intermittent   Denies any drainage from area

## 2020-07-15 NOTE — ED Provider Notes (Signed)
[facility]    CSN: [id number] Arrival date & time: 07/15/20  1109      History   Chief Complaint Chief Complaint  Patient presents with   axilla pain/swelling    HPI Shawn Logan is a 40 y.o. male.   HPI  Axillary pain: Patient states that for about 6 months he has had some off-and-on axillary pain and swelling.  He is unsure what is causing his symptoms.  No known triggers.  He has tried applying peroxide to his skin without much improvement.  He denies any unintentional weight loss or night sweats.  No history of MRSA.   Past Medical History:  Diagnosis Date   Anxiety    Depression     Patient Active Problem List   Diagnosis Date Noted   Chronic depression 05/24/2017   Alcohol use    Dyslipidemia    Current smoker 04/09/2015    History reviewed. No pertinent surgical history.   Home Medications    Prior to Admission medications   Medication Sig Start Date End Date Taking? Authorizing Provider  buPROPion (WELLBUTRIN SR) 150 MG 12 hr tablet Take 1 tablet (150 mg total) by mouth daily. 10/08/19 01/06/20  Georgina Quint, MD  rosuvastatin (CRESTOR) 10 MG tablet Take 1 tablet (10 mg total) by mouth daily. 07/12/20   Georgina Quint, MD  sertraline (ZOLOFT) 50 MG tablet Take 1 tablet (50 mg total) by mouth daily. 07/12/20   Georgina Quint, MD    Family History Family History  Problem Relation Age of Onset   Alcohol abuse Father    Drug abuse Sister        died 6 weeks ago   Cancer Maternal Grandmother    Cancer Maternal Grandfather        lung    Social History Social History   Tobacco Use   Smoking status: Every Day    Packs/day: 1.50    Pack years: 0.00    Types: Cigarettes   Smokeless tobacco: Never  Vaping Use   Vaping Use: Never used  Substance Use Topics   Alcohol use: Yes    Alcohol/week: 12.0 standard drinks    Types: 12 Cans of beer per week    Comment: weekend    Drug use: No     Allergies   Patient  has no known allergies.   Review of Systems Review of Systems  As stated above in HPI Physical Exam Triage Vital Signs ED Triage Vitals  Enc Vitals Group     BP 07/15/20 1154 111/76     Pulse Rate 07/15/20 1154 75     Resp 07/15/20 1154 18     Temp 07/15/20 1154 98.3 F (36.8 C)     Temp Source 07/15/20 1154 Oral     SpO2 07/15/20 1154 94 %     Weight --      Height --      Head Circumference --      Peak Flow --      Pain Score 07/15/20 1153 1     Pain Loc --      Pain Edu? --      Excl. in GC? --    No data found.  Updated Vital Signs BP 111/76 (BP Location: Right Arm)   Pulse 75   Temp 98.3 F (36.8 C) (Oral)   Resp 18   SpO2 94%   Physical Exam Vitals and nursing note reviewed.  Constitutional:  General: He is not in acute distress.    Appearance: Normal appearance. He is not ill-appearing, toxic-appearing or diaphoretic.  HENT:     Head: Normocephalic and atraumatic.  Cardiovascular:     Rate and Rhythm: Normal rate and regular rhythm.     Heart sounds: Normal heart sounds.  Pulmonary:     Effort: Pulmonary effort is normal.     Breath sounds: Normal breath sounds.  Chest:  Breasts:    Right: Axillary adenopathy present. No supraclavicular adenopathy.     Left: No axillary adenopathy or supraclavicular adenopathy.  Lymphadenopathy:     Head:     Right side of head: No submental, submandibular, tonsillar, preauricular, posterior auricular or occipital adenopathy.     Left side of head: No submental, submandibular, tonsillar, preauricular, posterior auricular or occipital adenopathy.     Cervical: No cervical adenopathy.     Right cervical: No superficial, deep or posterior cervical adenopathy.    Left cervical: No superficial, deep or posterior cervical adenopathy.     Upper Body:     Right upper body: Axillary adenopathy present. No supraclavicular, pectoral or epitrochlear adenopathy.     Left upper body: No supraclavicular, axillary, pectoral  or epitrochlear adenopathy.  Neurological:     Mental Status: He is alert.     UC Treatments / Results  Labs (all labs ordered are listed, but only abnormal results are displayed) Labs Reviewed - No data to display  EKG   Radiology No results found.  Procedures Procedures (including critical care time)  Medications Ordered in UC Medications - No data to display  Initial Impression / Assessment and Plan / UC Course  I have reviewed the triage vital signs and the nursing notes.  Pertinent labs & imaging results that were available during my care of the patient were reviewed by me and considered in my medical decision making (see chart for details).     New.  I have recommended to patient that he contact his primary care office today to get seen as he would likely will need an ultrasound of this area.  I discussed that if an enlarged lymph node is confirmed on ultrasound he likely will need blood work and potentially a biopsy on the lymph node to ensure that there is no abnormality. For now he can use warm compress and ibuprofen PRN.  Final Clinical Impressions(s) / UC Diagnoses   Final diagnoses:  None   Discharge Instructions   None    ED Prescriptions   None    PDMP not reviewed this encounter.   Rushie Chestnut, [place] 07/15/20 1209

## 2020-07-28 ENCOUNTER — Ambulatory Visit: Payer: BC Managed Care – PPO | Admitting: Emergency Medicine

## 2020-07-28 ENCOUNTER — Encounter: Payer: Self-pay | Admitting: Emergency Medicine

## 2020-07-28 ENCOUNTER — Other Ambulatory Visit: Payer: Self-pay

## 2020-07-28 VITALS — BP 118/70 | HR 67 | Ht 73.0 in | Wt 229.0 lb

## 2020-07-28 DIAGNOSIS — R590 Localized enlarged lymph nodes: Secondary | ICD-10-CM

## 2020-07-28 NOTE — Progress Notes (Signed)
Shawn Logan 40 y.o.   Chief Complaint  Patient presents with   Follow-up    Follow up from urgent care, pt states he feels ok.    HISTORY OF PRESENT ILLNESS: This is a 40 y.o. male here for follow-up of urgent care visit on 07/15/2020 for axillary lymphadenopathy which is now at least 50% better without any specific treatment. Today he has no complaints or medical concerns.  HPI   Prior to Admission medications   Medication Sig Start Date End Date Taking? Authorizing Provider  rosuvastatin (CRESTOR) 10 MG tablet Take 1 tablet (10 mg total) by mouth daily. 07/12/20  Yes Verlie Liotta, Eilleen Kempf, MD  sertraline (ZOLOFT) 50 MG tablet Take 1 tablet (50 mg total) by mouth daily. 07/12/20  Yes Georgina Quint, MD    No Known Allergies  Patient Active Problem List   Diagnosis Date Noted   Chronic depression 05/24/2017   Alcohol use    Dyslipidemia    Current smoker 04/09/2015    Past Medical History:  Diagnosis Date   Anxiety    Depression     No past surgical history on file.  Social History   Socioeconomic History   Marital status: Married    Spouse name: Jeanice Lim   Number of children: 1   Years of education: Not on file   Highest education level: Not on file  Occupational History   Not on file  Tobacco Use   Smoking status: Every Day    Packs/day: 1.50    Pack years: 0.00    Types: Cigarettes   Smokeless tobacco: Never  Vaping Use   Vaping Use: Never used  Substance and Sexual Activity   Alcohol use: Yes    Alcohol/week: 12.0 standard drinks    Types: 12 Cans of beer per week    Comment: weekend    Drug use: No   Sexual activity: Yes  Other Topics Concern   Not on file  Social History Narrative   Not on file   Social Determinants of Health   Financial Resource Strain: Not on file  Food Insecurity: Not on file  Transportation Needs: Not on file  Physical Activity: Not on file  Stress: Not on file  Social Connections: Not on file  Intimate  Partner Violence: Not on file    Family History  Problem Relation Age of Onset   Alcohol abuse Father    Drug abuse Sister        died 6 weeks ago   Cancer Maternal Grandmother    Cancer Maternal Grandfather        lung     Review of Systems  Constitutional: Negative.  Negative for chills and fever.  HENT:  Negative for congestion and sore throat.   Respiratory: Negative.  Negative for cough and shortness of breath.   Cardiovascular:  Negative for chest pain and palpitations.  Gastrointestinal:  Negative for abdominal pain, diarrhea, nausea and vomiting.  Genitourinary: Negative.   Skin: Negative.  Negative for rash.  Neurological: Negative.  Negative for dizziness and headaches.  All other systems reviewed and are negative.   Physical Exam Vitals reviewed.  Constitutional:      Appearance: Normal appearance.  HENT:     Head: Normocephalic.  Eyes:     Extraocular Movements: Extraocular movements intact.     Conjunctiva/sclera: Conjunctivae normal.     Pupils: Pupils are equal, round, and reactive to light.  Cardiovascular:     Rate and Rhythm: Normal rate  and regular rhythm.     Pulses: Normal pulses.     Heart sounds: Normal heart sounds.  Pulmonary:     Effort: Pulmonary effort is normal.     Breath sounds: Normal breath sounds.  Chest:  Breasts:    Right: No axillary adenopathy or supraclavicular adenopathy.     Left: No axillary adenopathy or supraclavicular adenopathy.  Musculoskeletal:        General: Normal range of motion.  Lymphadenopathy:     Cervical: No cervical adenopathy.     Upper Body:     Right upper body: No supraclavicular, axillary, pectoral or epitrochlear adenopathy.     Left upper body: No supraclavicular, axillary, pectoral or epitrochlear adenopathy.  Skin:    General: Skin is warm and dry.  Neurological:     Mental Status: He is alert.     ASSESSMENT & PLAN: Clinically stable.  No medical concerns identified during this  visit. Patient much improved.  Most likely viral illness.  No specific treatment needed at this time. Observation and reevaluation if it recurs.  Blood work today.  [name] was seen today for follow-up.  Diagnoses and all orders for this visit:  Axillary lymphadenopathy Comments: Much improved Orders: -     CBC with Differential/Platelet -     Comprehensive metabolic panel -     HIV Antibody (routine testing w rflx) -     Hemoglobin A1c  Patient Instructions  Lymphadenopathy  Lymphadenopathy means that your lymph glands are swollen or larger than normal. Lymph glands, also called lymph nodes, are collections of tissue that filter excess fluid, bacteria, viruses, and waste from your bloodstream. They are part of your body's disease-fighting system (immune system), which protects your body from germs. There may be different causes of lymphadenopathy, depending on where it is in your body. Some types go away on their own. Lymphadenopathy can occur anywhere that you have lymph glands, including these areas: Neck (cervical lymphadenopathy). Chest (mediastinal lymphadenopathy). Lungs (hilar lymphadenopathy). Underarms (axillary lymphadenopathy). Groin (inguinal lymphadenopathy). When your immune system responds to germs, infection-fighting cells and fluid build up in your lymph glands. This causes some swelling and enlargement. If the lymph nodes do not go back to normal size after you have an infection or disease, your health care provider may do tests. These tests help to monitor your condition and find the reason why the glands are still swollen andenlarged. Follow these instructions at home:  Get plenty of rest. Your health care provider may recommend over-the-counter medicines for pain. Take over-the-counter and prescription medicines only as told by your health care provider. If directed, apply heat to swollen lymph glands as often as told by your health care provider. Use the heat source  that your health care provider recommends, such as a moist heat pack or a heating pad. Place a towel between your skin and the heat source. Leave the heat on for 20-30 minutes. Remove the heat if your skin turns bright [name]. This is especially important if you are unable to feel pain, heat, or cold. You may have a greater risk of getting burned. Check your affected lymph glands every day for changes. Check other lymph gland areas as told by your health care provider. Check for changes such as: More swelling. Sudden increase in size. Redness or pain. Hardness. Keep all follow-up visits. This is important. Contact a health care provider if you have: Lymph glands that: Are still swollen after 2 weeks. Have suddenly gotten bigger or the  swelling spreads. Are [place], painful, or hard. Fluid leaking from the skin near an enlarged lymph gland. Problems with breathing. A fever, chills, or night sweats. Fatigue. A sore throat. Pain in your abdomen. Weight loss. Get help right away if you have: Severe pain. Chest pain. Shortness of breath. These symptoms may represent a serious problem that is an emergency. Do not wait to see if the symptoms will go away. Get medical help right away. Call your local emergency services (911 in the [place] Do not drive yourself to the hospital. Summary Lymphadenopathy means that your lymph glands are swollen or larger than normal. Lymph glands, also called lymph nodes, are collections of tissue that filter excess fluid, bacteria, viruses, and waste from the bloodstream. They are part of your body's disease-fighting system (immune system). Lymphadenopathy can occur anywhere that you have lymph glands. If the lymph nodes do not go back to normal size after you have an infection or disease, your health care provider may do tests to monitor your condition and find the reason why the glands are still swollen and enlarged. Check your affected lymph glands every day for  changes. Check other lymph gland areas as told by your health care provider. This information is not intended to replace advice given to you by your health care provider. Make sure you discuss any questions you have with your healthcare provider. Document Revised: 11/04/2019 Document Reviewed: 11/04/2019 Elsevier Patient Education  2022 Elsevier Inc.    Edwina Barth, MD [facility]

## 2020-07-28 NOTE — Patient Instructions (Signed)
Lymphadenopathy °Lymphadenopathy means that your lymph glands are swollen or larger than normal. Lymph glands, also called lymph nodes, are collections of tissue that filter excess fluid, bacteria, viruses, and waste from your bloodstream. They are part of your body's disease-fighting system (immune system), which protects your body from germs. °There may be different causes of lymphadenopathy, depending on where it is in your body. Some types go away on their own. Lymphadenopathy can occur anywhere that you have lymph glands, including these areas: °Neck (cervical lymphadenopathy). °Chest (mediastinal lymphadenopathy). °Lungs (hilar lymphadenopathy). °Underarms (axillary lymphadenopathy). °Groin (inguinal lymphadenopathy). °When your immune system responds to germs, infection-fighting cells and fluid build up in your lymph glands. This causes some swelling and enlargement. If the lymph nodes do not go back to normal size after you have an infection or disease, your health care provider may do tests. These tests help to monitor your condition and find the reason why the glands are still swollen and enlarged. °Follow these instructions at home: ° °Get plenty of rest. °Your health care provider may recommend over-the-counter medicines for pain. Take over-the-counter and prescription medicines only as told by your health care provider. °If directed, apply heat to swollen lymph glands as often as told by your health care provider. Use the heat source that your health care provider recommends, such as a moist heat pack or a heating pad. °Place a towel between your skin and the heat source. °Leave the heat on for 20-30 minutes. °Remove the heat if your skin turns bright [place]. This is especially important if you are unable to feel pain, heat, or cold. You may have a greater risk of getting burned. °Check your affected lymph glands every day for changes. Check other lymph gland areas as told by your health care provider.  Check for changes such as: °More swelling. °Sudden increase in size. °Redness or pain. °Hardness. °Keep all follow-up visits. This is important. °Contact a health care provider if you have: °Lymph glands that: °Are still swollen after 2 weeks. °Have suddenly gotten bigger or the swelling spreads. °Are [place], painful, or hard. °Fluid leaking from the skin near an enlarged lymph gland. °Problems with breathing. °A fever, chills, or night sweats. °Fatigue. °A sore throat. °Pain in your abdomen. °Weight loss. °Get help right away if you have: °Severe pain. °Chest pain. °Shortness of breath. °These symptoms may represent a serious problem that is an emergency. Do not wait to see if the symptoms will go away. Get medical help right away. Call your local emergency services (911 in the [place] Do not drive yourself to the hospital. °Summary °Lymphadenopathy means that your lymph glands are swollen or larger than normal. °Lymph glands, also called lymph nodes, are collections of tissue that filter excess fluid, bacteria, viruses, and waste from the bloodstream. They are part of your body's disease-fighting system (immune system). °Lymphadenopathy can occur anywhere that you have lymph glands. °If the lymph nodes do not go back to normal size after you have an infection or disease, your health care provider may do tests to monitor your condition and find the reason why the glands are still swollen and enlarged. °Check your affected lymph glands every day for changes. Check other lymph gland areas as told by your health care provider. °This information is not intended to replace advice given to you by your health care provider. Make sure you discuss any questions you have with your health care provider. °Document Revised: 11/04/2019 Document Reviewed: 11/04/2019 °Elsevier Patient Education © 2022   Elsevier Inc. ° °

## 2020-07-29 LAB — COMPREHENSIVE METABOLIC PANEL
ALT: 28 U/L (ref 0–53)
AST: 19 U/L (ref 0–37)
Albumin: 4.4 g/dL (ref 3.5–5.2)
Alkaline Phosphatase: 89 U/L (ref 39–117)
BUN: 13 mg/dL (ref 6–23)
CO2: 23 mEq/L (ref 19–32)
Calcium: 9.2 mg/dL (ref 8.4–10.5)
Chloride: 106 mEq/L (ref 96–112)
Creatinine, Ser: 1.06 mg/dL (ref 0.40–1.50)
GFR: 88.07 mL/min (ref >60.00)
Glucose, Bld: 75 mg/dL (ref 70–99)
Potassium: 3.9 mEq/L (ref 3.5–5.1)
Sodium: 139 mEq/L (ref 135–145)
Total Bilirubin: 0.4 mg/dL (ref 0.2–1.2)
Total Protein: 7.4 g/dL (ref 6.0–8.3)

## 2020-07-29 LAB — CBC WITH DIFFERENTIAL/PLATELET
Basophils Absolute: 0.2 10*3/uL — ABNORMAL HIGH (ref 0.0–0.1)
Basophils Relative: 1.4 % (ref 0.0–3.0)
Eosinophils Absolute: 0.5 10*3/uL (ref 0.0–0.7)
Eosinophils Relative: 4.1 % (ref 0.0–5.0)
HCT: 48.4 % (ref 39.0–52.0)
Hemoglobin: 16.8 g/dL (ref 13.0–17.0)
Lymphocytes Relative: 31.9 % (ref 12.0–46.0)
Lymphs Abs: 3.7 10*3/uL (ref 0.7–4.0)
MCHC: 34.6 g/dL (ref 30.0–36.0)
MCV: 82 fl (ref 78.0–100.0)
Monocytes Absolute: 1.2 10*3/uL — ABNORMAL HIGH (ref 0.1–1.0)
Monocytes Relative: 10.2 % (ref 3.0–12.0)
Neutro Abs: 6.1 10*3/uL (ref 1.4–7.7)
Neutrophils Relative %: 52.4 % (ref 43.0–77.0)
Platelets: 279 10*3/uL (ref 150.0–400.0)
RBC: 5.91 Mil/uL — ABNORMAL HIGH (ref 4.22–5.81)
RDW: 14 % (ref 11.5–15.5)
WBC: 11.6 10*3/uL — ABNORMAL HIGH (ref 4.0–10.5)

## 2020-07-29 LAB — HEMOGLOBIN A1C: Hgb A1c MFr Bld: 5.7 % (ref 4.6–6.5)

## 2020-07-29 LAB — HIV ANTIBODY (ROUTINE TESTING W REFLEX): HIV 1&2 Ab, 4th Generation: NONREACTIVE

## 2020-10-10 ENCOUNTER — Other Ambulatory Visit: Payer: Self-pay

## 2020-10-10 ENCOUNTER — Encounter: Payer: Self-pay | Admitting: Emergency Medicine

## 2020-10-10 ENCOUNTER — Ambulatory Visit (INDEPENDENT_AMBULATORY_CARE_PROVIDER_SITE_OTHER): Payer: BC Managed Care – PPO | Admitting: Emergency Medicine

## 2020-10-10 VITALS — BP 128/86 | HR 82 | Ht 73.0 in | Wt 233.0 lb

## 2020-10-10 DIAGNOSIS — Z130 Encounter for screening for diseases of the blood and blood-forming organs and certain disorders involving the immune mechanism: Secondary | ICD-10-CM

## 2020-10-10 DIAGNOSIS — F32A Depression, unspecified: Secondary | ICD-10-CM

## 2020-10-10 DIAGNOSIS — Z0000 Encounter for general adult medical examination without abnormal findings: Secondary | ICD-10-CM | POA: Diagnosis not present

## 2020-10-10 DIAGNOSIS — E785 Hyperlipidemia, unspecified: Secondary | ICD-10-CM

## 2020-10-10 DIAGNOSIS — Z1329 Encounter for screening for other suspected endocrine disorder: Secondary | ICD-10-CM | POA: Diagnosis not present

## 2020-10-10 DIAGNOSIS — Z13228 Encounter for screening for other metabolic disorders: Secondary | ICD-10-CM

## 2020-10-10 DIAGNOSIS — Z13220 Encounter for screening for lipoid disorders: Secondary | ICD-10-CM | POA: Diagnosis not present

## 2020-10-10 DIAGNOSIS — F17200 Nicotine dependence, unspecified, uncomplicated: Secondary | ICD-10-CM

## 2020-10-10 NOTE — Patient Instructions (Signed)
Health Maintenance, Male Adopting a healthy lifestyle and getting preventive care are important in promoting health and wellness. Ask your health care provider about: The right schedule for you to have regular tests and exams. Things you can do on your own to prevent diseases and keep yourself healthy. What should I know about diet, weight, and exercise? Eat a healthy diet  Eat a diet that includes plenty of vegetables, fruits, low-fat dairy products, and lean protein. Do not eat a lot of foods that are high in solid fats, added sugars, or sodium. Maintain a healthy weight Body mass index (BMI) is a measurement that can be used to identify possible weight problems. It estimates body fat based on height and weight. Your health care provider can help determine your BMI and help you achieve or maintain a healthy weight. Get regular exercise Get regular exercise. This is one of the most important things you can do for your health. Most adults should: Exercise for at least 150 minutes each week. The exercise should increase your heart rate and make you sweat (moderate-intensity exercise). Do strengthening exercises at least twice a week. This is in addition to the moderate-intensity exercise. Spend less time sitting. Even light physical activity can be beneficial. Watch cholesterol and blood lipids Have your blood tested for lipids and cholesterol at 40 years of age, then have this test every 5 years. You may need to have your cholesterol levels checked more often if: Your lipid or cholesterol levels are high. You are older than 40 years of age. You are at high risk for heart disease. What should I know about cancer screening? Many types of cancers can be detected early and may often be prevented. Depending on your health history and family history, you may need to have cancer screening at various ages. This may include screening for: Colorectal cancer. Prostate cancer. Skin cancer. Lung  cancer. What should I know about heart disease, diabetes, and high blood pressure? Blood pressure and heart disease High blood pressure causes heart disease and increases the risk of stroke. This is more likely to develop in people who have high blood pressure readings, are of African descent, or are overweight. Talk with your health care provider about your target blood pressure readings. Have your blood pressure checked: Every 3-5 years if you are 18-39 years of age. Every year if you are 40 years old or older. If you are between the ages of 65 and 75 and are a current or former smoker, ask your health care provider if you should have a one-time screening for abdominal aortic aneurysm (AAA). Diabetes Have regular diabetes screenings. This checks your fasting blood sugar level. Have the screening done: Once every three years after age 45 if you are at a normal weight and have a low risk for diabetes. More often and at a younger age if you are overweight or have a high risk for diabetes. What should I know about preventing infection? Hepatitis B If you have a higher risk for hepatitis B, you should be screened for this virus. Talk with your health care provider to find out if you are at risk for hepatitis B infection. Hepatitis C Blood testing is recommended for: Everyone born from 1945 through 1965. Anyone with known risk factors for hepatitis C. Sexually transmitted infections (STIs) You should be screened each year for STIs, including gonorrhea and chlamydia, if: You are sexually active and are younger than 40 years of age. You are older than 40 years   of age and your health care provider tells you that you are at risk for this type of infection. Your sexual activity has changed since you were last screened, and you are at increased risk for chlamydia or gonorrhea. Ask your health care provider if you are at risk. Ask your health care provider about whether you are at high risk for HIV.  Your health care provider may recommend a prescription medicine to help prevent HIV infection. If you choose to take medicine to prevent HIV, you should first get tested for HIV. You should then be tested every 3 months for as long as you are taking the medicine. Follow these instructions at home: Lifestyle Do not use any products that contain nicotine or tobacco, such as cigarettes, e-cigarettes, and chewing tobacco. If you need help quitting, ask your health care provider. Do not use street drugs. Do not share needles. Ask your health care provider for help if you need support or information about quitting drugs. Alcohol use Do not drink alcohol if your health care provider tells you not to drink. If you drink alcohol: Limit how much you have to 0-2 drinks a day. Be aware of how much alcohol is in your drink. In the [place] one drink equals one 12 oz bottle of beer (355 mL), one 5 oz glass of wine (148 mL), or one 1 oz glass of hard liquor (44 mL). General instructions Schedule regular health, dental, and eye exams. Stay current with your vaccines. Tell your health care provider if: You often feel depressed. You have ever been abused or do not feel safe at home. Summary Adopting a healthy lifestyle and getting preventive care are important in promoting health and wellness. Follow your health care provider's instructions about healthy diet, exercising, and getting tested or screened for diseases. Follow your health care provider's instructions on monitoring your cholesterol and blood pressure. This information is not intended to replace advice given to you by your health care provider. Make sure you discuss any questions you have with your health care provider. Document Revised: 03/18/2020 Document Reviewed: 01/01/2018 Elsevier Patient Education  2022 Elsevier Inc.  

## 2020-10-10 NOTE — Progress Notes (Signed)
Shawn Logan 40 y.o.   Chief Complaint  Patient presents with   Annual Exam    HISTORY OF PRESENT ILLNESS: This is a 39 y.o. male here for annual exam. Chronic smoker, cigarettes and marijuana. History of depression on Zoloft 50 mg daily.  Working well for him. History of dyslipidemia on rosuvastatin 10 mg daily. Has no complaints or medical concerns today.  HPI   Prior to Admission medications   Medication Sig Start Date End Date Taking? Authorizing Provider  rosuvastatin (CRESTOR) 10 MG tablet Take 1 tablet (10 mg total) by mouth daily. 07/12/20  Yes Ayaansh Smail, Eilleen Kempf, MD  sertraline (ZOLOFT) 50 MG tablet Take 1 tablet (50 mg total) by mouth daily. 07/12/20  Yes Georgina Quint, MD    No Known Allergies  Patient Active Problem List   Diagnosis Date Noted   Chronic depression 05/24/2017   Alcohol use    Dyslipidemia    Current smoker 04/09/2015    Past Medical History:  Diagnosis Date   Anxiety    Depression     No past surgical history on file.  Social History   Socioeconomic History   Marital status: Married    Spouse name: Jeanice Lim   Number of children: 1   Years of education: Not on file   Highest education level: Not on file  Occupational History   Not on file  Tobacco Use   Smoking status: Every Day    Packs/day: 1.50    Types: Cigarettes   Smokeless tobacco: Never  Vaping Use   Vaping Use: Never used  Substance and Sexual Activity   Alcohol use: Yes    Alcohol/week: 12.0 standard drinks    Types: 12 Cans of beer per week    Comment: weekend    Drug use: No   Sexual activity: Yes  Other Topics Concern   Not on file  Social History Narrative   Not on file   Social Determinants of Health   Financial Resource Strain: Not on file  Food Insecurity: Not on file  Transportation Needs: Not on file  Physical Activity: Not on file  Stress: Not on file  Social Connections: Not on file  Intimate Partner Violence: Not on file    Family  History  Problem Relation Age of Onset   Alcohol abuse Father    Drug abuse Sister        died 6 weeks ago   Cancer Maternal Grandmother    Cancer Maternal Grandfather        lung     Review of Systems  Constitutional: Negative.  Negative for chills and fever.  HENT: Negative.  Negative for congestion and sore throat.   Respiratory: Negative.  Negative for cough and shortness of breath.   Cardiovascular:  Negative for chest pain and palpitations.  Gastrointestinal:  Negative for abdominal pain, diarrhea, nausea and vomiting.  Genitourinary: Negative.  Negative for dysuria and hematuria.  Musculoskeletal: Negative.   Skin: Negative.  Negative for rash.  Neurological: Negative.  Negative for dizziness and headaches.  All other systems reviewed and are negative.   Physical Exam Vitals reviewed.  Constitutional:      Appearance: Normal appearance.  HENT:     Head: Normocephalic.     Right Ear: Tympanic membrane, ear canal and external ear normal.     Left Ear: Tympanic membrane, ear canal and external ear normal.     Mouth/Throat:     Mouth: Mucous membranes are moist.  Pharynx: Oropharynx is clear.  Eyes:     Extraocular Movements: Extraocular movements intact.     Conjunctiva/sclera: Conjunctivae normal.     Pupils: Pupils are equal, round, and reactive to light.  Neck:     Vascular: No carotid bruit.  Cardiovascular:     Rate and Rhythm: Normal rate and regular rhythm.     Pulses: Normal pulses.     Heart sounds: Normal heart sounds.  Pulmonary:     Effort: Pulmonary effort is normal.     Breath sounds: Normal breath sounds.  Abdominal:     General: There is no distension.     Palpations: Abdomen is soft. There is no mass.     Tenderness: There is no abdominal tenderness.  Musculoskeletal:        General: Normal range of motion.     Cervical back: Normal range of motion and neck supple. No tenderness.     Right lower leg: No edema.     Left lower leg: No  edema.  Lymphadenopathy:     Cervical: No cervical adenopathy.  Skin:    General: Skin is warm and dry.     Capillary Refill: Capillary refill takes less than 2 seconds.  Neurological:     General: No focal deficit present.     Mental Status: He is alert and oriented to person, place, and time.  Psychiatric:        Mood and Affect: Mood normal.        Behavior: Behavior normal.     ASSESSMENT & PLAN: Kassidy was seen today for annual exam.  Diagnoses and all orders for this visit:  Routine general medical examination at a health care facility  Screening for deficiency anemia -     CBC with Differential  Screening for lipoid disorders -     Lipid panel  Screening for endocrine, metabolic and immunity disorder -     Comprehensive metabolic panel -     Hemoglobin A1c -     Hepatitis C antibody screen  Dyslipidemia  Chronic depression  Current smoker  Modifiable risk factors discussed with patient. Anticipatory guidance according to age provided. The following topics were also discussed: Social Determinants of Health Smoking.  Cardiovascular and cancer risks associated with smoking discussed.  Smoking cessation advice given. Diet and nutrition Benefits of exercise Cancer family history which includes lung cancer Vaccinations recommendations Cardiovascular risk assessment Mental health including depression and anxiety Fall and accident prevention  Patient Instructions  Health Maintenance, Male Adopting a healthy lifestyle and getting preventive care are important in promoting health and wellness. Ask your health care provider about: The right schedule for you to have regular tests and exams. Things you can do on your own to prevent diseases and keep yourself healthy. What should I know about diet, weight, and exercise? Eat a healthy diet  Eat a diet that includes plenty of vegetables, fruits, low-fat dairy products, and lean protein. Do not eat a lot of foods that  are high in solid fats, added sugars, or sodium. Maintain a healthy weight Body mass index (BMI) is a measurement that can be used to identify possible weight problems. It estimates body fat based on height and weight. Your health care provider can help determine your BMI and help you achieve or maintain a healthy weight. Get regular exercise Get regular exercise. This is one of the most important things you can do for your health. Most adults should: Exercise for at least 150 minutes  each week. The exercise should increase your heart rate and make you sweat (moderate-intensity exercise). Do strengthening exercises at least twice a week. This is in addition to the moderate-intensity exercise. Spend less time sitting. Even light physical activity can be beneficial. Watch cholesterol and blood lipids Have your blood tested for lipids and cholesterol at 39 years of age, then have this test every 5 years. You may need to have your cholesterol levels checked more often if: Your lipid or cholesterol levels are high. You are older than 40 years of age. You are at high risk for heart disease. What should I know about cancer screening? Many types of cancers can be detected early and may often be prevented. Depending on your health history and family history, you may need to have cancer screening at various ages. This may include screening for: Colorectal cancer. Prostate cancer. Skin cancer. Lung cancer. What should I know about heart disease, diabetes, and high blood pressure? Blood pressure and heart disease High blood pressure causes heart disease and increases the risk of stroke. This is more likely to develop in people who have high blood pressure readings, are of African descent, or are overweight. Talk with your health care provider about your target blood pressure readings. Have your blood pressure checked: Every 3-5 years if you are 67-52 years of age. Every year if you are 79 years old or  older. If you are between the ages of 45 and 49 and are a current or former smoker, ask your health care provider if you should have a one-time screening for abdominal aortic aneurysm (AAA). Diabetes Have regular diabetes screenings. This checks your fasting blood sugar level. Have the screening done: Once every three years after age 30 if you are at a normal weight and have a low risk for diabetes. More often and at a younger age if you are overweight or have a high risk for diabetes. What should I know about preventing infection? Hepatitis B If you have a higher risk for hepatitis B, you should be screened for this virus. Talk with your health care provider to find out if you are at risk for hepatitis B infection. Hepatitis C Blood testing is recommended for: Everyone born from [birth year] through 1965. Anyone with known risk factors for hepatitis C. Sexually transmitted infections (STIs) You should be screened each year for STIs, including gonorrhea and chlamydia, if: You are sexually active and are younger than 40 years of age. You are older than 40 years of age and your health care provider tells you that you are at risk for this type of infection. Your sexual activity has changed since you were last screened, and you are at increased risk for chlamydia or gonorrhea. Ask your health care provider if you are at risk. Ask your health care provider about whether you are at high risk for HIV. Your health care provider may recommend a prescription medicine to help prevent HIV infection. If you choose to take medicine to prevent HIV, you should first get tested for HIV. You should then be tested every 3 months for as long as you are taking the medicine. Follow these instructions at home: Lifestyle Do not use any products that contain nicotine or tobacco, such as cigarettes, e-cigarettes, and chewing tobacco. If you need help quitting, ask your health care provider. Do not use street drugs. Do not  share needles. Ask your health care provider for help if you need support or information about quitting drugs. Alcohol use  Do not drink alcohol if your health care provider tells you not to drink. If you drink alcohol: Limit how much you have to 0-2 drinks a day. Be aware of how much alcohol is in your drink. In the [place] one drink equals one 12 oz bottle of beer (355 mL), one 5 oz glass of wine (148 mL), or one 1 oz glass of hard liquor (44 mL). General instructions Schedule regular health, dental, and eye exams. Stay current with your vaccines. Tell your health care provider if: You often feel depressed. You have ever been abused or do not feel safe at home. Summary Adopting a healthy lifestyle and getting preventive care are important in promoting health and wellness. Follow your health care provider's instructions about healthy diet, exercising, and getting tested or screened for diseases. Follow your health care provider's instructions on monitoring your cholesterol and blood pressure. This information is not intended to replace advice given to you by your health care provider. Make sure you discuss any questions you have with your health care provider. Document Revised: 03/18/2020 Document Reviewed: 01/01/2018 Elsevier Patient Education  2022 Elsevier Inc.     Edwina Barth, MD [facility]

## 2020-10-11 LAB — LIPID PANEL
Cholesterol: 148 mg/dL (ref 0–200)
HDL: 41.7 mg/dL (ref >39.00)
LDL Cholesterol: 71 mg/dL (ref 0–99)
NonHDL: 106.54
Total CHOL/HDL Ratio: 4
Triglycerides: 177 mg/dL — ABNORMAL HIGH (ref 0.0–149.0)
VLDL: 35.4 mg/dL (ref 0.0–40.0)

## 2020-10-11 LAB — CBC WITH DIFFERENTIAL/PLATELET
Basophils Absolute: 0.2 10*3/uL — ABNORMAL HIGH (ref 0.0–0.1)
Basophils Relative: 1.5 % (ref 0.0–3.0)
Eosinophils Absolute: 0.2 10*3/uL (ref 0.0–0.7)
Eosinophils Relative: 1.8 % (ref 0.0–5.0)
HCT: 49.1 % (ref 39.0–52.0)
Hemoglobin: 16.5 g/dL (ref 13.0–17.0)
Lymphocytes Relative: 22.9 % (ref 12.0–46.0)
Lymphs Abs: 2.4 10*3/uL (ref 0.7–4.0)
MCHC: 33.7 g/dL (ref 30.0–36.0)
MCV: 82.7 fl (ref 78.0–100.0)
Monocytes Absolute: 0.8 10*3/uL (ref 0.1–1.0)
Monocytes Relative: 7.8 % (ref 3.0–12.0)
Neutro Abs: 6.8 10*3/uL (ref 1.4–7.7)
Neutrophils Relative %: 66 % (ref 43.0–77.0)
Platelets: 259 10*3/uL (ref 150.0–400.0)
RBC: 5.94 Mil/uL — ABNORMAL HIGH (ref 4.22–5.81)
RDW: 13.4 % (ref 11.5–15.5)
WBC: 10.3 10*3/uL (ref 4.0–10.5)

## 2020-10-11 LAB — COMPREHENSIVE METABOLIC PANEL
ALT: 29 U/L (ref 0–53)
AST: 19 U/L (ref 0–37)
Albumin: 4.5 g/dL (ref 3.5–5.2)
Alkaline Phosphatase: 91 U/L (ref 39–117)
BUN: 11 mg/dL (ref 6–23)
CO2: 26 mEq/L (ref 19–32)
Calcium: 9.5 mg/dL (ref 8.4–10.5)
Chloride: 105 mEq/L (ref 96–112)
Creatinine, Ser: 1 mg/dL (ref 0.40–1.50)
GFR: 94.31 mL/min (ref >60.00)
Glucose, Bld: 97 mg/dL (ref 70–99)
Potassium: 3.7 mEq/L (ref 3.5–5.1)
Sodium: 141 mEq/L (ref 135–145)
Total Bilirubin: 0.5 mg/dL (ref 0.2–1.2)
Total Protein: 7.7 g/dL (ref 6.0–8.3)

## 2020-10-11 LAB — HEPATITIS C ANTIBODY
Hepatitis C Ab: NONREACTIVE
SIGNAL TO CUT-OFF: 0.1 (ref <1.00)

## 2020-10-11 LAB — HEMOGLOBIN A1C: Hgb A1c MFr Bld: 5.8 % (ref 4.6–6.5)

## 2020-10-12 ENCOUNTER — Telehealth: Payer: Self-pay

## 2020-10-12 NOTE — Telephone Encounter (Signed)
Left message informing patient to call back regarding results.

## 2020-10-12 NOTE — Telephone Encounter (Signed)
-----   Message from [facility], MD sent at 10/11/2020 12:15 PM EDT ----- Call patient please.  Normal labs.  Thanks.

## 2021-05-16 ENCOUNTER — Encounter: Payer: Self-pay | Admitting: Emergency Medicine

## 2021-05-16 ENCOUNTER — Ambulatory Visit: Payer: BC Managed Care – PPO | Admitting: Emergency Medicine

## 2021-05-16 VITALS — BP 120/78 | HR 70 | Temp 98.2°F | Ht 73.0 in | Wt 215.2 lb

## 2021-05-16 DIAGNOSIS — E785 Hyperlipidemia, unspecified: Secondary | ICD-10-CM | POA: Diagnosis not present

## 2021-05-16 DIAGNOSIS — R7303 Prediabetes: Secondary | ICD-10-CM

## 2021-05-16 DIAGNOSIS — F32A Depression, unspecified: Secondary | ICD-10-CM

## 2021-05-16 LAB — COMPREHENSIVE METABOLIC PANEL
ALT: 16 U/L (ref 0–53)
AST: 18 U/L (ref 0–37)
Albumin: 4.4 g/dL (ref 3.5–5.2)
Alkaline Phosphatase: 91 U/L (ref 39–117)
BUN: 11 mg/dL (ref 6–23)
CO2: 27 mEq/L (ref 19–32)
Calcium: 9.1 mg/dL (ref 8.4–10.5)
Chloride: 103 mEq/L (ref 96–112)
Creatinine, Ser: 0.95 mg/dL (ref 0.40–1.50)
GFR: 99.88 mL/min (ref >60.00)
Glucose, Bld: 79 mg/dL (ref 70–99)
Potassium: 3.7 mEq/L (ref 3.5–5.1)
Sodium: 137 mEq/L (ref 135–145)
Total Bilirubin: 0.5 mg/dL (ref 0.2–1.2)
Total Protein: 7.3 g/dL (ref 6.0–8.3)

## 2021-05-16 LAB — LIPID PANEL
Cholesterol: 188 mg/dL (ref 0–200)
HDL: 46.6 mg/dL (ref >39.00)
LDL Cholesterol: 111 mg/dL — ABNORMAL HIGH (ref 0–99)
NonHDL: 141.52
Total CHOL/HDL Ratio: 4
Triglycerides: 154 mg/dL — ABNORMAL HIGH (ref 0.0–149.0)
VLDL: 30.8 mg/dL (ref 0.0–40.0)

## 2021-05-16 MED ORDER — SERTRALINE HCL 50 MG PO TABS: 50.0000 mg | ORAL_TABLET | Freq: Every day | ORAL | 3 refills | Status: DC

## 2021-05-16 NOTE — Patient Instructions (Signed)
Health Maintenance, Male Adopting a healthy lifestyle and getting preventive care are important in promoting health and wellness. Ask your health care provider about: The right schedule for you to have regular tests and exams. Things you can do on your own to prevent diseases and keep yourself healthy. What should I know about diet, weight, and exercise? Eat a healthy diet  Eat a diet that includes plenty of vegetables, fruits, low-fat dairy products, and lean protein. Do not eat a lot of foods that are high in solid fats, added sugars, or sodium. Maintain a healthy weight Body mass index (BMI) is a measurement that can be used to identify possible weight problems. It estimates body fat based on height and weight. Your health care provider can help determine your BMI and help you achieve or maintain a healthy weight. Get regular exercise Get regular exercise. This is one of the most important things you can do for your health. Most adults should: Exercise for at least 150 minutes each week. The exercise should increase your heart rate and make you sweat (moderate-intensity exercise). Do strengthening exercises at least twice a week. This is in addition to the moderate-intensity exercise. Spend less time sitting. Even light physical activity can be beneficial. Watch cholesterol and blood lipids Have your blood tested for lipids and cholesterol at 41 years of age, then have this test every 5 years. You may need to have your cholesterol levels checked more often if: Your lipid or cholesterol levels are high. You are older than 40 years of age. You are at high risk for heart disease. What should I know about cancer screening? Many types of cancers can be detected early and may often be prevented. Depending on your health history and family history, you may need to have cancer screening at various ages. This may include screening for: Colorectal cancer. Prostate cancer. Skin cancer. Lung  cancer. What should I know about heart disease, diabetes, and high blood pressure? Blood pressure and heart disease High blood pressure causes heart disease and increases the risk of stroke. This is more likely to develop in people who have high blood pressure readings or are overweight. Talk with your health care provider about your target blood pressure readings. Have your blood pressure checked: Every 3-5 years if you are 18-39 years of age. Every year if you are 40 years old or older. If you are between the ages of 65 and 75 and are a current or former smoker, ask your health care provider if you should have a one-time screening for abdominal aortic aneurysm (AAA). Diabetes Have regular diabetes screenings. This checks your fasting blood sugar level. Have the screening done: Once every three years after age 45 if you are at a normal weight and have a low risk for diabetes. More often and at a younger age if you are overweight or have a high risk for diabetes. What should I know about preventing infection? Hepatitis B If you have a higher risk for hepatitis B, you should be screened for this virus. Talk with your health care provider to find out if you are at risk for hepatitis B infection. Hepatitis C Blood testing is recommended for: Everyone born from 1945 through 1965. Anyone with known risk factors for hepatitis C. Sexually transmitted infections (STIs) You should be screened each year for STIs, including gonorrhea and chlamydia, if: You are sexually active and are younger than 41 years of age. You are older than 41 years of age and your   health care provider tells you that you are at risk for this type of infection. Your sexual activity has changed since you were last screened, and you are at increased risk for chlamydia or gonorrhea. Ask your health care provider if you are at risk. Ask your health care provider about whether you are at high risk for HIV. Your health care provider  may recommend a prescription medicine to help prevent HIV infection. If you choose to take medicine to prevent HIV, you should first get tested for HIV. You should then be tested every 3 months for as long as you are taking the medicine. Follow these instructions at home: Alcohol use Do not drink alcohol if your health care provider tells you not to drink. If you drink alcohol: Limit how much you have to 0-2 drinks a day. Know how much alcohol is in your drink. In the [place] one drink equals one 12 oz bottle of beer (355 mL), one 5 oz glass of wine (148 mL), or one 1 oz glass of hard liquor (44 mL). Lifestyle Do not use any products that contain nicotine or tobacco. These products include cigarettes, chewing tobacco, and vaping devices, such as e-cigarettes. If you need help quitting, ask your health care provider. Do not use street drugs. Do not share needles. Ask your health care provider for help if you need support or information about quitting drugs. General instructions Schedule regular health, dental, and eye exams. Stay current with your vaccines. Tell your health care provider if: You often feel depressed. You have ever been abused or do not feel safe at home. Summary Adopting a healthy lifestyle and getting preventive care are important in promoting health and wellness. Follow your health care provider's instructions about healthy diet, exercising, and getting tested or screened for diseases. Follow your health care provider's instructions on monitoring your cholesterol and blood pressure. This information is not intended to replace advice given to you by your health care provider. Make sure you discuss any questions you have with your health care provider. Document Revised: 05/30/2020 Document Reviewed: 05/30/2020 Elsevier Patient Education  2023 [employer].  

## 2021-05-16 NOTE — Progress Notes (Signed)
Shawn Logan ?41 y.o. ? ? ?Chief Complaint  ?Patient presents with  ? Medication Management  ?  Pt was on Zoloft in the past and wants to concern starting it again   ? Labs Only  ?  Pt wants cholesterol check  ? ? ?HISTORY OF PRESENT ILLNESS: ?This is a 41 y.o. male with history of chronic depression.  Wants to get started back on Zoloft. ?Also has history of dyslipidemia.  Requesting blood work. ?No other complaints or medical concerns today. ? ?HPI ? ? ?Prior to Admission medications   ?Medication Sig Start Date End Date Taking? Authorizing Provider  ?rosuvastatin (CRESTOR) 10 MG tablet Take 1 tablet (10 mg total) by mouth daily. ?Patient not taking: Reported on 05/16/2021 07/12/20   Georgina Quint, MD  ?sertraline (ZOLOFT) 50 MG tablet Take 1 tablet (50 mg total) by mouth daily. 05/16/21   Georgina Quint, MD  ? ? ?No Known Allergies ? ?Patient Active Problem List  ? Diagnosis Date Noted  ? Chronic depression 05/24/2017  ? Alcohol use   ? Dyslipidemia   ? Current smoker 04/09/2015  ? ? ?Past Medical History:  ?Diagnosis Date  ? Anxiety   ? Depression   ? ? ?No past surgical history on file. ? ?Social History  ? ?Socioeconomic History  ? Marital status: Married  ?  Spouse name: Jeanice Lim  ? Number of children: 1  ? Years of education: Not on file  ? Highest education level: Not on file  ?Occupational History  ? Not on file  ?Tobacco Use  ? Smoking status: Every Day  ?  Packs/day: 1.50  ?  Types: Cigarettes  ? Smokeless tobacco: Never  ?Vaping Use  ? Vaping Use: Never used  ?Substance and Sexual Activity  ? Alcohol use: Yes  ?  Alcohol/week: 12.0 standard drinks  ?  Types: 12 Cans of beer per week  ?  Comment: weekend   ? Drug use: No  ? Sexual activity: Yes  ?Other Topics Concern  ? Not on file  ?Social History Narrative  ? Not on file  ? ?Social Determinants of Health  ? ?Financial Resource Strain: Not on file  ?Food Insecurity: Not on file  ?Transportation Needs: Not on file  ?Physical Activity: Not on  file  ?Stress: Not on file  ?Social Connections: Not on file  ?Intimate Partner Violence: Not on file  ? ? ?Family History  ?Problem Relation Age of Onset  ? Alcohol abuse Father   ? Drug abuse Sister   ?     died 6 weeks ago  ? Cancer Maternal Grandmother   ? Cancer Maternal Grandfather   ?     lung  ? ? ? ?Review of Systems  ?Constitutional: Negative.  Negative for chills and fever.  ?HENT: Negative.    ?Cardiovascular: Negative.   ?Gastrointestinal: Negative.  Negative for abdominal pain, nausea and vomiting.  ?Genitourinary: Negative.   ?Skin: Negative.  Negative for rash.  ?Neurological:  Negative for dizziness and headaches.  ?Psychiatric/Behavioral:  Positive for depression.   ?All other systems reviewed and are negative. ? ?Today's Vitals  ? 05/16/21 1529  ?BP: 120/78  ?Pulse: 70  ?Temp: 98.2 ?F (36.8 ?C)  ?TempSrc: Oral  ?SpO2: 98%  ?Weight: 215 lb 4 oz (97.6 kg)  ?Height: 6\' 1"  (1.854 m)  ? ?Body mass index is 28.4 kg/m?. ? ?Physical Exam ?Vitals reviewed.  ?Constitutional:   ?   Appearance: Normal appearance.  ?HENT:  ?  Head: Normocephalic.  ?   Mouth/Throat:  ?   Mouth: Mucous membranes are moist.  ?   Pharynx: Oropharynx is clear.  ?Eyes:  ?   Extraocular Movements: Extraocular movements intact.  ?   Pupils: Pupils are equal, round, and reactive to light.  ?Cardiovascular:  ?   Rate and Rhythm: Normal rate and regular rhythm.  ?   Pulses: Normal pulses.  ?   Heart sounds: Normal heart sounds.  ?Pulmonary:  ?   Effort: Pulmonary effort is normal.  ?   Breath sounds: Normal breath sounds.  ?Musculoskeletal:     ?   General: Normal range of motion.  ?   Cervical back: No tenderness.  ?Lymphadenopathy:  ?   Cervical: No cervical adenopathy.  ?Skin: ?   General: Skin is warm and dry.  ?   Capillary Refill: Capillary refill takes less than 2 seconds.  ?Neurological:  ?   General: No focal deficit present.  ?   Mental Status: He is alert and oriented to person, place, and time.  ?Psychiatric:     ?   Mood  and Affect: Mood normal.     ?   Behavior: Behavior normal.  ? ? ? ?ASSESSMENT & PLAN: ?Problem List Items Addressed This Visit   ? ?  ? Other  ? Dyslipidemia  ?  Lipid profile done today.  Not taking rosuvastatin at present time. ?Diet and nutrition discussed. ? ?  ?  ? Relevant Orders  ? Comprehensive metabolic panel  ? Lipid panel  ? Chronic depression - Primary  ?  Currently active.  Chronic depression for over 10 years and affecting quality of life. ?We will restart Zoloft 50 mg daily and refer to psychiatry. ? ?  ?  ? Relevant Medications  ? sertraline (ZOLOFT) 50 MG tablet  ? Other Relevant Orders  ? Ambulatory referral to Psychiatry  ? Prediabetes  ?  Diet and nutrition discussed.  Hemoglobin A1c done today. ?Advised to decrease amount of daily carbohydrate intake. ? ?  ?  ? Relevant Orders  ? Hemoglobin A1c  ? ? ? ?Patient Instructions  ?Health Maintenance, Male ?Adopting a healthy lifestyle and getting preventive care are important in promoting health and wellness. Ask your health care provider about: ?The right schedule for you to have regular tests and exams. ?Things you can do on your own to prevent diseases and keep yourself healthy. ?What should I know about diet, weight, and exercise? ?Eat a healthy diet ? ?Eat a diet that includes plenty of vegetables, fruits, low-fat dairy products, and lean protein. ?Do not eat a lot of foods that are high in solid fats, added sugars, or sodium. ?Maintain a healthy weight ?Body mass index (BMI) is a measurement that can be used to identify possible weight problems. It estimates body fat based on height and weight. Your health care provider can help determine your BMI and help you achieve or maintain a healthy weight. ?Get regular exercise ?Get regular exercise. This is one of the most important things you can do for your health. Most adults should: ?Exercise for at least 150 minutes each week. The exercise should increase your heart rate and make you sweat  (moderate-intensity exercise). ?Do strengthening exercises at least twice a week. This is in addition to the moderate-intensity exercise. ?Spend less time sitting. Even light physical activity can be beneficial. ?Watch cholesterol and blood lipids ?Have your blood tested for lipids and cholesterol at 41 years of age, then have  this test every 5 years. ?You may need to have your cholesterol levels checked more often if: ?Your lipid or cholesterol levels are high. ?You are older than 41 years of age. ?You are at high risk for heart disease. ?What should I know about cancer screening? ?Many types of cancers can be detected early and may often be prevented. Depending on your health history and family history, you may need to have cancer screening at various ages. This may include screening for: ?Colorectal cancer. ?Prostate cancer. ?Skin cancer. ?Lung cancer. ?What should I know about heart disease, diabetes, and high blood pressure? ?Blood pressure and heart disease ?High blood pressure causes heart disease and increases the risk of stroke. This is more likely to develop in people who have high blood pressure readings or are overweight. ?Talk with your health care provider about your target blood pressure readings. ?Have your blood pressure checked: ?Every 3-5 years if you are 3918-41 years of age. ?Every year if you are 317 years old or older. ?If you are between the ages of 3365 and 5275 and are a current or former smoker, ask your health care provider if you should have a one-time screening for abdominal aortic aneurysm (AAA). ?Diabetes ?Have regular diabetes screenings. This checks your fasting blood sugar level. Have the screening done: ?Once every three years after age 41 if you are at a normal weight and have a low risk for diabetes. ?More often and at a younger age if you are overweight or have a high risk for diabetes. ?What should I know about preventing infection? ?Hepatitis B ?If you have a higher risk for  hepatitis B, you should be screened for this virus. Talk with your health care provider to find out if you are at risk for hepatitis B infection. ?Hepatitis C ?Blood testing is recommended for: ?Everyone born from [birth year]

## 2021-05-16 NOTE — Assessment & Plan Note (Signed)
Lipid profile done today.  Not taking rosuvastatin at present time. ?Diet and nutrition discussed. ?

## 2021-05-16 NOTE — Assessment & Plan Note (Signed)
Currently active.  Chronic depression for over 10 years and affecting quality of life. ?We will restart Zoloft 50 mg daily and refer to psychiatry. ?

## 2021-05-16 NOTE — Assessment & Plan Note (Signed)
Diet and nutrition discussed.  Hemoglobin A1c done today. °Advised to decrease amount of daily carbohydrate intake. °

## 2021-05-17 LAB — HEMOGLOBIN A1C: Hgb A1c MFr Bld: 5.7 % (ref 4.6–6.5)

## 2021-07-18 ENCOUNTER — Ambulatory Visit (HOSPITAL_COMMUNITY): Payer: Self-pay | Admitting: Psychiatry

## 2021-11-25 ENCOUNTER — Telehealth: Payer: BC Managed Care – PPO | Admitting: Nurse Practitioner

## 2021-11-25 DIAGNOSIS — U071 COVID-19: Secondary | ICD-10-CM

## 2021-11-25 MED ORDER — MOLNUPIRAVIR EUA 200MG CAPSULE: 4.0000 | ORAL_CAPSULE | Freq: Two times a day (BID) | ORAL | 0 refills | Status: AC

## 2021-11-25 NOTE — Progress Notes (Signed)
Virtual Visit Consent   Shawn Logan, you are scheduled for a virtual visit with Mary-Margaret Hassell Done, FNP, a [facility] provider, today.     Just as with appointments in the office, your consent must be obtained to participate.  Your consent will be active for this visit and any virtual visit you may have with one of our providers in the next 365 days.     If you have a MyChart account, a copy of this consent can be sent to you electronically.  All virtual visits are billed to your insurance company just like a traditional visit in the office.    As this is a virtual visit, video technology does not allow for your provider to perform a traditional examination.  This may limit your provider's ability to fully assess your condition.  If your provider identifies any concerns that need to be evaluated in person or the need to arrange testing (such as labs, EKG, etc.), we will make arrangements to do so.     Although advances in technology are sophisticated, we cannot ensure that it will always work on either your end or our end.  If the connection with a video visit is poor, the visit may have to be switched to a telephone visit.  With either a video or telephone visit, we are not always able to ensure that we have a secure connection.     I need to obtain your verbal consent now.   Are you willing to proceed with your visit today? YES   Shawn Logan has provided verbal consent on 11/25/2021 for a virtual visit (video or telephone).   Mary-Margaret Hassell Done, FNP   Date: 11/25/2021 4:00 PM   Virtual Visit via Video Note   I, Mary-Margaret Primus Gritton, connected with Shawn Logan ([id number], Mar 23, 1980) on 11/25/21 at  4:15 PM EDT by a video-enabled telemedicine application and verified that I am speaking with the correct person using two identifiers.  Location: Patient: Virtual Visit Location Patient: Home Provider: Virtual Visit Location Provider: Mobile   I discussed the limitations of  evaluation and management by telemedicine and the availability of in person appointments. The patient expressed understanding and agreed to proceed.    History of Present Illness: Shawn Logan is a 41 y.o. who identifies as a male who was assigned male at birth, and is being seen today for covid positive.  HPI: URI  This is a new problem. The problem has been waxing and waning. The maximum temperature recorded prior to his arrival was 100.4 - 100.9 F. Associated symptoms include congestion, coughing, ear pain and headaches. Pertinent negatives include no sore throat. Associated symptoms comments: myalgia. He has tried NSAIDs and acetaminophen (zofran) for the symptoms. The treatment provided mild relief.   Has not done covid test- his wife had covid 2 weeks ago and her parents have it now.  Review of Systems  HENT:  Positive for congestion and ear pain. Negative for sore throat.   Respiratory:  Positive for cough.   Neurological:  Positive for headaches.    Problems:  Patient Active Problem List   Diagnosis Date Noted   Prediabetes 05/16/2021   Chronic depression 05/24/2017   Alcohol use    Dyslipidemia    Current smoker 04/09/2015    Allergies: No Known Allergies Medications:  Current Outpatient Medications:    rosuvastatin (CRESTOR) 10 MG tablet, Take 1 tablet (10 mg total) by mouth daily. (Patient not taking: Reported on 05/16/2021), Disp:  90 tablet, Rfl: 3   sertraline (ZOLOFT) 50 MG tablet, Take 1 tablet (50 mg total) by mouth daily., Disp: 90 tablet, Rfl: 3  Observations/Objective: Patient is well-developed, well-nourished in no acute distress.  Resting comfortably  at home.  Head is normocephalic, atraumatic.  No labored breathing.  Speech is clear and coherent with logical content.  Patient is alert and oriented at baseline.    Assessment and Plan:  Shawn Logan in today with chief complaint of Covid Positive   1. Positive self-administered antigen test for  COVID-19 1. Take meds as prescribed 2. Use a cool mist humidifier especially during the winter months and when heat has been humid. 3. Use saline nose sprays frequently 4. Saline irrigations of the nose can be very helpful if done frequently.  * 4X daily for 1 week*  * Use of a nettie pot can be helpful with this. Follow directions with this* 5. Drink plenty of fluids 6. Keep thermostat turn down low 7.For any cough or congestion- OTC cough meds 8. For fever or aces or pains- take tylenol or ibuprofen appropriate for age and weight.  * for fevers greater than 101 orally you may alternate ibuprofen and tylenol every  3 hours.   Meds ordered this encounter  Medications   molnupiravir EUA (LAGEVRIO) 200 mg CAPS capsule    Sig: Take 4 capsules (800 mg total) by mouth 2 (two) times daily for 5 days.    Dispense:  40 capsule    Refill:  0    Order Specific Question:   Supervising Provider    Answer:   Merrilee Jansky [id number]     Follow Up Instructions: I discussed the assessment and treatment plan with the patient. The patient was provided an opportunity to ask questions and all were answered. The patient agreed with the plan and demonstrated an understanding of the instructions.  A copy of instructions were sent to the patient via MyChart.  The patient was advised to call back or seek an in-person evaluation if the symptoms worsen or if the condition fails to improve as anticipated.  Time:  I spent 7 minutes with the patient via telehealth technology discussing the above problems/concerns.    Mary-Margaret Daphine Deutscher, FNP

## 2021-11-25 NOTE — Patient Instructions (Signed)
  Shawn Logan, thank you for joining Chevis Pretty, FNP for today's virtual visit.  While this provider is not your primary care provider (PCP), if your PCP is located in our provider database this encounter information will be shared with them immediately following your visit.   [place] [place] account gives you access to today's visit and all your visits, tests, and labs performed at [facility] " click here if you don't have a [facility] account or go to [email or web address]  Consent: (Patient) Dyer L Streb provided verbal consent for this virtual visit at the beginning of the encounter.  Current Medications:  Current Outpatient Medications:    molnupiravir EUA (LAGEVRIO) 200 mg CAPS capsule, Take 4 capsules (800 mg total) by mouth 2 (two) times daily for 5 days., Disp: 40 capsule, Rfl: 0   rosuvastatin (CRESTOR) 10 MG tablet, Take 1 tablet (10 mg total) by mouth daily. (Patient not taking: Reported on 05/16/2021), Disp: 90 tablet, Rfl: 3   sertraline (ZOLOFT) 50 MG tablet, Take 1 tablet (50 mg total) by mouth daily., Disp: 90 tablet, Rfl: 3   Medications ordered in this encounter:  Meds ordered this encounter  Medications   molnupiravir EUA (LAGEVRIO) 200 mg CAPS capsule    Sig: Take 4 capsules (800 mg total) by mouth 2 (two) times daily for 5 days.    Dispense:  40 capsule    Refill:  0    Order Specific Question:   Supervising Provider    Answer:   Chase Picket [id number]     *If you need refills on other medications prior to your next appointment, please contact your pharmacy*  Follow-Up: Call back or seek an in-person evaluation if the symptoms worsen or if the condition fails to improve as anticipated.  [place] [phone]  Other Instructions 1. Take meds as prescribed 2. Use a cool mist humidifier especially during the winter months and when heat has been humid. 3. Use saline nose sprays frequently 4. Saline  irrigations of the nose can be very helpful if done frequently.  * 4X daily for 1 week*  * Use of a nettie pot can be helpful with this. Follow directions with this* 5. Drink plenty of fluids 6. Keep thermostat turn down low 7.For any cough or congestion- OTC cough meds 8. For fever or aces or pains- take tylenol or ibuprofen appropriate for age and weight.  * for fevers greater than 101 orally you may alternate ibuprofen and tylenol every  3 hours.      If you have been instructed to have an in-person evaluation today at a local Urgent Care facility, please use the link below. It will take you to a list of all of our available [facility], including address, phone number and hours of operation. Please do not delay care.  [facility]  If you or a family member do not have a primary care provider, use the link below to schedule a visit and establish care. When you choose a [facility] primary care physician or advanced practice provider, you gain a long-term partner in health. Find a Primary Care Provider  Learn more about [facility]'s in-office and virtual care options: [facility]

## 2022-05-22 ENCOUNTER — Other Ambulatory Visit: Payer: Self-pay | Admitting: Emergency Medicine

## 2022-05-22 DIAGNOSIS — F32A Depression, unspecified: Secondary | ICD-10-CM

## 2023-01-20 DIAGNOSIS — R051 Acute cough: Secondary | ICD-10-CM | POA: Diagnosis not present

## 2023-01-20 DIAGNOSIS — Z6830 Body mass index (BMI) 30.0-30.9, adult: Secondary | ICD-10-CM | POA: Diagnosis not present

## 2023-01-20 DIAGNOSIS — J029 Acute pharyngitis, unspecified: Secondary | ICD-10-CM | POA: Diagnosis not present

## 2023-01-20 DIAGNOSIS — H66001 Acute suppurative otitis media without spontaneous rupture of ear drum, right ear: Secondary | ICD-10-CM | POA: Diagnosis not present

## 2023-01-25 ENCOUNTER — Other Ambulatory Visit: Payer: Self-pay | Admitting: Emergency Medicine

## 2023-01-25 DIAGNOSIS — F32A Depression, unspecified: Secondary | ICD-10-CM

## 2023-01-30 ENCOUNTER — Ambulatory Visit (HOSPITAL_BASED_OUTPATIENT_CLINIC_OR_DEPARTMENT_OTHER): Payer: 59 | Admitting: Family Medicine

## 2023-01-30 ENCOUNTER — Ambulatory Visit: Payer: Self-pay | Admitting: Emergency Medicine

## 2023-01-30 ENCOUNTER — Encounter (HOSPITAL_BASED_OUTPATIENT_CLINIC_OR_DEPARTMENT_OTHER): Payer: Self-pay | Admitting: Family Medicine

## 2023-01-30 VITALS — BP 128/91 | HR 70 | Ht 73.0 in | Wt 238.0 lb

## 2023-01-30 DIAGNOSIS — K921 Melena: Secondary | ICD-10-CM | POA: Diagnosis not present

## 2023-01-30 NOTE — Progress Notes (Signed)
 Acute Office Visit  Subjective:     Patient ID: Shawn Logan, male    DOB: [birth year], 43 y.o.   MRN: [id number]  Chief Complaint  Patient presents with   Rectal Bleeding    First noticed small amount of bleeding last week, noticed Logan again today wife states toilet bowel was full of Logan. Patient denies constipation and straining. He is having normal bowel movements, stomach ache last week no stomach pains since    Shawn Logan is a 43 year old male patient who presents today for an acute visit with complaint of rectal bleeding that happened about a week ago and occurred this morning. The first time it occurred, he reports it was a small amount of Logan. Today, it was a large amount of bright Shawn Logan in the toilet.  Abdominal pain has happened a couple of times- suprapubic pain  LBM 1/8, denies straining or hard stool. Denies issues with constipation Denies lightheaded/feeling of dizziness, N/V, urinary symptoms, severe abdominal pain, fever/chills.   Has been using Aleve PRN, not daily  Still taking zoloft   Associated symptoms include: lower abdominal pain Denies known family history of colon cancer  Denies heavy alcohol use- reports drinking last on Christmas Eve  Symptoms:  Fever/chills/body aches: no Nausea/vomiting: no Rash: no  Weight loss: no Logan in stool: yes Heartburn: no Hematemesis: no  Diarrhea: no Constipation: no Dysuria/urinary frequency or urgency: no Hematuria: no History of sexually transmitted disease: no Recurrent NSAID use: yes EtOH use: no  Recent NSAID use: no   ROS: see HPI  Objective:    BP (!) 128/91   Pulse 70   Ht 6' 1 (1.854 m)   Wt 238 lb (108 kg)   SpO2 100%   BMI 31.40 kg/m   Physical Exam Vitals reviewed.  Constitutional:      Appearance: Normal appearance.  Cardiovascular:     Rate and Rhythm: Normal rate and regular rhythm.     Pulses: Normal pulses.     Heart sounds: Normal heart sounds.  Pulmonary:     Effort:  Pulmonary effort is normal.     Breath sounds: Normal breath sounds.  Genitourinary:    Rectum: Guaiac result positive (indetermined results). Tenderness and anal fissure (healing ?) present. No external hemorrhoid or internal hemorrhoid. Abnormal anal tone (hypertonia).  Neurological:     Mental Status: He is alert.  Psychiatric:        Mood and Affect: Mood normal.        Behavior: Behavior normal.    Assessment & Plan:   1. Logan in stool, frank (Primary) Shawn Logan is a 43 year old male patient who presents today for an acute visit with complaint of rectal bleeding that happened about a week ago and occurred this morning in larger quantity. Associated symptoms include lower abdominal pain that is intermittent. Denies constipation and reports that he has a bowel movement every day without straining or pain with defecation. Physical exam performed with Shawn Logan, CMA present. Physical exam with hypertonia of rectum. No external hemorrhoids visualized. Around 5 o'clock position, questionable healing fissure present. Patient reports it is tender in this area. FOBT performed in office. Faint blue positive present on edge of sample. Advised patient to follow-up with GI for mor in-depth evaluation. Urgent referral placed to GI. Discussed symptoms/when to seek emergency care Patient and wife verbalized an understanding and all questions were answered at this time.  - Ambulatory referral to Gastroenterology - POCT Occult Logan Stool  Return in about 4 weeks (around 02/27/2023) for rectal bleeding with PCP.  Evalene Arts, FNP

## 2023-01-30 NOTE — Telephone Encounter (Signed)
  Chief Complaint: rectal bleeding Symptoms: intermittent lower abdominal pain, blood in stool Frequency: occurred once 2-3 days ago; occurred once this AM Pertinent Negatives: Patient's wife denies nausea or vomiting, chest pain, SOB, dizziness, fevers. Disposition: [] ED /[] Urgent Care (no appt availability in office) / [x] Appointment(In office/virtual)/ []  [facility]/ [] Home Care/ [] Refused Recommended Disposition /[] [facility]/ []  Follow-up with PCP Additional Notes: Patient's wife Silvano called in for triage. Wife verbalizes understanding that if dizziness, increased occurrence of bleeding, abdominal pain last 2 hours or more to have patient cancel appointment and go to ED.   Copied from CRM [id number]. Topic: Clinical - [place] Word Triage >> Jan 30, 2023  7:58 AM Eleanor C wrote: [facility] that prompted transfer to Nurse Triage: patient woke up this morning and had a lot of blood in his stool Reason for Disposition  MODERATE rectal bleeding (small blood clots, passing blood without stool, or toilet water turns [place])  Answer Assessment - Initial Assessment Questions 1. APPEARANCE of BLOOD: What color is it? Is it passed separately, on the surface of the stool, or mixed in with the stool?      Bright Deonta blood for both instances. Wife states she is unsure if there was blood on toilet tissue. She states it looked mixed in with stool.  2. AMOUNT: How much blood was passed?      Patient's wife reports this AM patient had BM and the toilet bowl water was full of blood.  3. FREQUENCY: How many times has blood been passed with the stools?      X2, once this AM and once the other day.  4. ONSET: When was the blood first seen in the stools? (Days or weeks)      2-3 days ago, one occurrence and lower abdominal pain.  5. DIARRHEA: Is there also some diarrhea? If Yes, ask: How many diarrhea stools in the past 24 hours?      Wife states on Christmas day patient had BM  in his underwear which was very out of character. She states she thinks he has been having more stool.  6. CONSTIPATION: Do you have constipation? If Yes, ask: How bad is it?     Wife unsure.  7. RECURRENT SYMPTOMS: Have you had blood in your stools before? If Yes, ask: When was the last time? and What happened that time?      Denies.  8. BLOOD THINNERS: Do you take any blood thinners? (e.g., Coumadin/warfarin, Pradaxa/dabigatran, aspirin )     Denies.  9. OTHER SYMPTOMS: Do you have any other symptoms?  (e.g., abdomen pain, vomiting, dizziness, fever)     Intermittent lower abdominal pain.  Protocols used: Rectal Bleeding-A-AH

## 2023-01-31 LAB — HEMOCCULT GUIAC POC 1CARD (OFFICE): Fecal Occult Blood, POC: POSITIVE — AB

## 2023-06-27 ENCOUNTER — Other Ambulatory Visit: Payer: Self-pay | Admitting: Emergency Medicine

## 2023-06-27 DIAGNOSIS — F32A Depression, unspecified: Secondary | ICD-10-CM

## 2023-06-28 ENCOUNTER — Telehealth: Payer: Self-pay

## 2023-06-28 ENCOUNTER — Telehealth: Payer: Self-pay | Admitting: Emergency Medicine

## 2023-06-28 NOTE — Telephone Encounter (Unsigned)
 Copied from CRM [id number]. Topic: Clinical - Medication Refill >> Jun 28, 2023  2:48 PM Baldo Levan wrote: Medication:  sertraline  (ZOLOFT ) 50 MG tablet   Has the patient contacted their pharmacy? Yes (Agent: If no, request that the patient contact the pharmacy for the refill. If patient does not wish to contact the pharmacy document the reason why and proceed with request.) (Agent: If yes, when and what did the pharmacy advise?)  This is the patient's preferred pharmacy:  [id number], [place] - [address] [place] [address] Phone: [phone] Fax: [phone]   Is this the correct pharmacy for this prescription? Yes If no, delete pharmacy and type the correct one.   Has the prescription been filled recently? No  Is the patient out of the medication? Yes  Has the patient been seen for an appointment in the last year OR does the patient have an upcoming appointment? Yes  Can we respond through MyChart? No  Agent: Please be advised that Rx refills may take up to 3 business days. We ask that you follow-up with your pharmacy.

## 2023-06-28 NOTE — Telephone Encounter (Signed)
 Copied from CRM [id number]. Topic: Clinical - Medication Refill >> Jun 28, 2023  2:48 PM Baldo Levan wrote: Medication:  sertraline  (ZOLOFT ) 50 MG tablet   Has the patient contacted their pharmacy? Yes (Agent: If no, request that the patient contact the pharmacy for the refill. If patient does not wish to contact the pharmacy document the reason why and proceed with request.) (Agent: If yes, when and what did the pharmacy advise?)  This is the patient's preferred pharmacy:  [id number] [place] [address], [place] - [address] [place] [address] Phone: [phone] Fax: [phone]   Is this the correct pharmacy for this prescription? Yes If no, delete pharmacy and type the correct one.   Has the prescription been filled recently? No  Is the patient out of the medication? Yes  Has the patient been seen for an appointment in the last year OR does the patient have an upcoming appointment? Yes  Can we respond through MyChart? No  Agent: Please be advised that Rx refills may take up to 3 business days. We ask that you follow-up with your pharmacy. >> Jun 28, 2023  4:19 PM Baldo Levan wrote: Patient is scheduled with his PCP for 6/11 for a visit due to not being seen in over two years. Patient is requesting a short term refill until then due to being completely out of the medication.

## 2023-06-30 ENCOUNTER — Other Ambulatory Visit: Payer: Self-pay | Admitting: Emergency Medicine

## 2023-06-30 DIAGNOSIS — F32A Depression, unspecified: Secondary | ICD-10-CM

## 2023-06-30 MED ORDER — SERTRALINE HCL 50 MG PO TABS: 50.0000 mg | ORAL_TABLET | Freq: Every day | ORAL | 0 refills | Status: DC

## 2023-06-30 NOTE — Telephone Encounter (Signed)
New prescription sent to pharmacy of record today.

## 2023-07-03 ENCOUNTER — Ambulatory Visit: Admitting: Emergency Medicine

## 2023-07-03 ENCOUNTER — Encounter: Payer: Self-pay | Admitting: Emergency Medicine

## 2023-07-03 ENCOUNTER — Ambulatory Visit: Payer: Self-pay | Admitting: Emergency Medicine

## 2023-07-03 VITALS — BP 130/90 | HR 65 | Temp 97.5°F | Ht 73.0 in | Wt 245.0 lb

## 2023-07-03 DIAGNOSIS — F32A Depression, unspecified: Secondary | ICD-10-CM | POA: Diagnosis not present

## 2023-07-03 DIAGNOSIS — R29818 Other symptoms and signs involving the nervous system: Secondary | ICD-10-CM

## 2023-07-03 DIAGNOSIS — R7303 Prediabetes: Secondary | ICD-10-CM

## 2023-07-03 DIAGNOSIS — E785 Hyperlipidemia, unspecified: Secondary | ICD-10-CM

## 2023-07-03 LAB — COMPREHENSIVE METABOLIC PANEL WITH GFR
ALT: 26 U/L (ref 0–53)
AST: 19 U/L (ref 0–37)
Albumin: 4.5 g/dL (ref 3.5–5.2)
Alkaline Phosphatase: 111 U/L (ref 39–117)
BUN: 15 mg/dL (ref 6–23)
CO2: 26 meq/L (ref 19–32)
Calcium: 9.4 mg/dL (ref 8.4–10.5)
Chloride: 105 meq/L (ref 96–112)
Creatinine, Ser: 1.01 mg/dL (ref 0.40–1.50)
GFR: 91.43 mL/min (ref >60.00)
Glucose, Bld: 92 mg/dL (ref 70–99)
Potassium: 4 meq/L (ref 3.5–5.1)
Sodium: 138 meq/L (ref 135–145)
Total Bilirubin: 0.6 mg/dL (ref 0.2–1.2)
Total Protein: 7.6 g/dL (ref 6.0–8.3)

## 2023-07-03 LAB — CBC WITH DIFFERENTIAL/PLATELET
Basophils Absolute: 0.1 10*3/uL (ref 0.0–0.1)
Basophils Relative: 0.9 % (ref 0.0–3.0)
Eosinophils Absolute: 0.3 10*3/uL (ref 0.0–0.7)
Eosinophils Relative: 2.8 % (ref 0.0–5.0)
HCT: 47.3 % (ref 39.0–52.0)
Hemoglobin: 15.7 g/dL (ref 13.0–17.0)
Lymphocytes Relative: 30.7 % (ref 12.0–46.0)
Lymphs Abs: 2.9 10*3/uL (ref 0.7–4.0)
MCHC: 33.2 g/dL (ref 30.0–36.0)
MCV: 81.6 fl (ref 78.0–100.0)
Monocytes Absolute: 0.8 10*3/uL (ref 0.1–1.0)
Monocytes Relative: 8.3 % (ref 3.0–12.0)
Neutro Abs: 5.4 10*3/uL (ref 1.4–7.7)
Neutrophils Relative %: 57.3 % (ref 43.0–77.0)
Platelets: 278 10*3/uL (ref 150.0–400.0)
RBC: 5.8 Mil/uL (ref 4.22–5.81)
RDW: 13.8 % (ref 11.5–15.5)
WBC: 9.4 10*3/uL (ref 4.0–10.5)

## 2023-07-03 LAB — LIPID PANEL
Cholesterol: 217 mg/dL — ABNORMAL HIGH (ref 0–200)
HDL: 39.6 mg/dL (ref >39.00)
LDL Cholesterol: 126 mg/dL — ABNORMAL HIGH (ref 0–99)
NonHDL: 177.18
Total CHOL/HDL Ratio: 5
Triglycerides: 254 mg/dL — ABNORMAL HIGH (ref 0.0–149.0)
VLDL: 50.8 mg/dL — ABNORMAL HIGH (ref 0.0–40.0)

## 2023-07-03 LAB — HEMOGLOBIN A1C: Hgb A1c MFr Bld: 5.8 % (ref 4.6–6.5)

## 2023-07-03 MED ORDER — SERTRALINE HCL 50 MG PO TABS: 50.0000 mg | ORAL_TABLET | Freq: Every day | ORAL | 1 refills | Status: DC

## 2023-07-03 MED ORDER — SERTRALINE HCL 50 MG PO TABS
50.0000 mg | ORAL_TABLET | Freq: Every day | ORAL | 3 refills | Status: DC
Start: 2023-07-03 — End: 2023-09-09

## 2023-07-03 NOTE — Assessment & Plan Note (Signed)
 Chronic but stable Improved with Zoloft  Recommend to continue 50 mg daily

## 2023-07-03 NOTE — Assessment & Plan Note (Signed)
Diet and nutrition discussed.  Hemoglobin A1c done today. °Advised to decrease amount of daily carbohydrate intake. °

## 2023-07-03 NOTE — Assessment & Plan Note (Signed)
Lipid profile done today.  Not taking rosuvastatin at present time. ?Diet and nutrition discussed. ?

## 2023-07-03 NOTE — Progress Notes (Signed)
 Shawn Logan 43 y.o.   Chief Complaint  Patient presents with   Follow-up    Medication follow up, wants to discuss sleep apnea states he snores a lot     HISTORY OF PRESENT ILLNESS: This is a 43 y.o. male here for medication follow-up Needs refill on Zoloft .  Has been taking this medication for the last couple years which has been helping No longer drinking.  Not smoking. Concerned about sleep apnea.  Snores.  Has daytime somnolence.  Needs referral for sleep studies No other complaints or medical concerns today.  HPI   Prior to Admission medications   Medication Sig Start Date End Date Taking? Authorizing Provider  sertraline  (ZOLOFT ) 50 MG tablet Take 1 tablet (50 mg total) by mouth daily. 07/03/23   Elvira Hammersmith, MD    No Known Allergies  Patient Active Problem List   Diagnosis Date Noted   Prediabetes 05/16/2021   Chronic depression 05/24/2017   Alcohol use    Dyslipidemia    Current smoker 04/09/2015    Past Medical History:  Diagnosis Date   Anxiety    Depression     History reviewed. No pertinent surgical history.  Social History   Socioeconomic History   Marital status: Married    Spouse name: Buddie Carina   Number of children: 1   Years of education: Not on file   Highest education level: Not on file  Occupational History   Not on file  Tobacco Use   Smoking status: Every Day    Current packs/day: 1.50    Types: Cigarettes   Smokeless tobacco: Never  Vaping Use   Vaping status: Never Used  Substance and Sexual Activity   Alcohol use: Yes    Alcohol/week: 12.0 standard drinks of alcohol    Types: 12 Cans of beer per week    Comment: weekend    Drug use: No   Sexual activity: Yes  Other Topics Concern   Not on file  Social History Narrative   Not on file   Social Drivers of Health   Financial Resource Strain: Not on file  Food Insecurity: Not on file  Transportation Needs: Not on file  Physical Activity: Not on file  Stress: Not  on file  Social Connections: Not on file  Intimate Partner Violence: Not on file    Family History  Problem Relation Age of Onset   Alcohol abuse Father    Drug abuse Sister        died 6 weeks ago   Cancer Maternal Grandmother    Cancer Maternal Grandfather        lung     Review of Systems  Constitutional: Negative.  Negative for chills and fever.  HENT: Negative.  Negative for congestion and sore throat.   Respiratory: Negative.  Negative for cough and shortness of breath.   Cardiovascular: Negative.  Negative for chest pain and palpitations.  Gastrointestinal:  Negative for abdominal pain, diarrhea, nausea and vomiting.  Genitourinary: Negative.  Negative for dysuria and hematuria.  Skin: Negative.  Negative for rash.  Neurological: Negative.  Negative for dizziness and headaches.  All other systems reviewed and are negative.   There were no vitals filed for this visit.  Physical Exam Vitals reviewed.  Constitutional:      Appearance: Normal appearance.  HENT:     Head: Normocephalic.     Mouth/Throat:     Mouth: Mucous membranes are moist.     Pharynx: Oropharynx is clear.  Eyes:     Extraocular Movements: Extraocular movements intact.     Conjunctiva/sclera: Conjunctivae normal.     Pupils: Pupils are equal, round, and reactive to light.  Cardiovascular:     Rate and Rhythm: Normal rate and regular rhythm.     Pulses: Normal pulses.     Heart sounds: Normal heart sounds.  Pulmonary:     Effort: Pulmonary effort is normal.     Breath sounds: Normal breath sounds.  Abdominal:     Palpations: Abdomen is soft.     Tenderness: There is no abdominal tenderness.  Musculoskeletal:     Cervical back: No tenderness.  Lymphadenopathy:     Cervical: No cervical adenopathy.  Skin:    General: Skin is warm and dry.     Capillary Refill: Capillary refill takes less than 2 seconds.  Neurological:     General: No focal deficit present.     Mental Status: He is alert  and oriented to person, place, and time.  Psychiatric:        Mood and Affect: Mood normal.        Behavior: Behavior normal.      ASSESSMENT & PLAN: A total of 45 minutes was spent with the patient and counseling/coordination of care regarding preparing for this visit, review of most recent office visit notes, review of multiple chronic medical conditions and their management, review of all medications, review of most recent bloodwork results, review of health maintenance items, education on nutrition, prognosis, documentation, and need for follow up.   Problem List Items Addressed This Visit       Other   Dyslipidemia   Lipid profile done today.  Not taking rosuvastatin  at present time. Diet and nutrition discussed.      Relevant Orders   Comprehensive metabolic panel with GFR   CBC with Differential/Platelet   Hemoglobin A1c   Lipid panel   Chronic depression - Primary   Chronic but stable Improved with Zoloft  Recommend to continue 50 mg daily      Relevant Medications   sertraline  (ZOLOFT ) 50 MG tablet   Prediabetes   Diet and nutrition discussed.  Hemoglobin A1c done today. Advised to decrease amount of daily carbohydrate intake.      Relevant Orders   Comprehensive metabolic panel with GFR   CBC with Differential/Platelet   Hemoglobin A1c   Lipid panel   Suspected sleep apnea   Snores, wakes up tired, nonrestorative sleep, daytime somnolence.  Suspected sleep apnea.  Recommend sleep studies.  Referral placed today.      Relevant Orders   Ambulatory referral to Sleep Studies   Patient Instructions  Health Maintenance, Male Adopting a healthy lifestyle and getting preventive care are important in promoting health and wellness. Ask your health care provider about: The right schedule for you to have regular tests and exams. Things you can do on your own to prevent diseases and keep yourself healthy. What should I know about diet, weight, and exercise? Eat a  healthy diet  Eat a diet that includes plenty of vegetables, fruits, low-fat dairy products, and lean protein. Do not eat a lot of foods that are high in solid fats, added sugars, or sodium. Maintain a healthy weight Body mass index (BMI) is a measurement that can be used to identify possible weight problems. It estimates body fat based on height and weight. Your health care provider can help determine your BMI and help you achieve or maintain a healthy weight. Get regular  exercise Get regular exercise. This is one of the most important things you can do for your health. Most adults should: Exercise for at least 150 minutes each week. The exercise should increase your heart rate and make you sweat (moderate-intensity exercise). Do strengthening exercises at least twice a week. This is in addition to the moderate-intensity exercise. Spend less time sitting. Even light physical activity can be beneficial. Watch cholesterol and blood lipids Have your blood tested for lipids and cholesterol at 43 years of age, then have this test every 5 years. You may need to have your cholesterol levels checked more often if: Your lipid or cholesterol levels are high. You are older than 43 years of age. You are at high risk for heart disease. What should I know about cancer screening? Many types of cancers can be detected early and may often be prevented. Depending on your health history and family history, you may need to have cancer screening at various ages. This may include screening for: Colorectal cancer. Prostate cancer. Skin cancer. Lung cancer. What should I know about heart disease, diabetes, and high blood pressure? Blood pressure and heart disease High blood pressure causes heart disease and increases the risk of stroke. This is more likely to develop in people who have high blood pressure readings or are overweight. Talk with your health care provider about your target blood pressure  readings. Have your blood pressure checked: Every 3-5 years if you are 60-78 years of age. Every year if you are 69 years old or older. If you are between the ages of 67 and 64 and are a current or former smoker, ask your health care provider if you should have a one-time screening for abdominal aortic aneurysm (AAA). Diabetes Have regular diabetes screenings. This checks your fasting blood sugar level. Have the screening done: Once every three years after age 37 if you are at a normal weight and have a low risk for diabetes. More often and at a younger age if you are overweight or have a high risk for diabetes. What should I know about preventing infection? Hepatitis B If you have a higher risk for hepatitis B, you should be screened for this virus. Talk with your health care provider to find out if you are at risk for hepatitis B infection. Hepatitis C Blood testing is recommended for: Everyone born from [birth year] through 1965. Anyone with known risk factors for hepatitis C. Sexually transmitted infections (STIs) You should be screened each year for STIs, including gonorrhea and chlamydia, if: You are sexually active and are younger than 43 years of age. You are older than 43 years of age and your health care provider tells you that you are at risk for this type of infection. Your sexual activity has changed since you were last screened, and you are at increased risk for chlamydia or gonorrhea. Ask your health care provider if you are at risk. Ask your health care provider about whether you are at high risk for HIV. Your health care provider may recommend a prescription medicine to help prevent HIV infection. If you choose to take medicine to prevent HIV, you should first get tested for HIV. You should then be tested every 3 months for as long as you are taking the medicine. Follow these instructions at home: Alcohol use Do not drink alcohol if your health care provider tells you not to  drink. If you drink alcohol: Limit how much you have to 0-2 drinks a day. Know how  much alcohol is in your drink. In the [place] one drink equals one 12 oz bottle of beer (355 mL), one 5 oz glass of wine (148 mL), or one 1 oz glass of hard liquor (44 mL). Lifestyle Do not use any products that contain nicotine  or tobacco. These products include cigarettes, chewing tobacco, and vaping devices, such as e-cigarettes. If you need help quitting, ask your health care provider. Do not use street drugs. Do not share needles. Ask your health care provider for help if you need support or information about quitting drugs. General instructions Schedule regular health, dental, and eye exams. Stay current with your vaccines. Tell your health care provider if: You often feel depressed. You have ever been abused or do not feel safe at home. Summary Adopting a healthy lifestyle and getting preventive care are important in promoting health and wellness. Follow your health care provider's instructions about healthy diet, exercising, and getting tested or screened for diseases. Follow your health care provider's instructions on monitoring your cholesterol and blood pressure. This information is not intended to replace advice given to you by your health care provider. Make sure you discuss any questions you have with your health care provider. Document Revised: 05/30/2020 Document Reviewed: 05/30/2020 Elsevier Patient Education  2024 [employer]vier Inc.     Maryagnes Small, MD [facility]

## 2023-07-03 NOTE — Assessment & Plan Note (Signed)
 Snores, wakes up tired, nonrestorative sleep, daytime somnolence.  Suspected sleep apnea.  Recommend sleep studies.  Referral placed today.

## 2023-07-03 NOTE — Patient Instructions (Signed)
 Health Maintenance, Male  Adopting a healthy lifestyle and getting preventive care are important in promoting health and wellness. Ask your health care provider about:  The right schedule for you to have regular tests and exams.  Things you can do on your own to prevent diseases and keep yourself healthy.  What should I know about diet, weight, and exercise?  Eat a healthy diet    Eat a diet that includes plenty of vegetables, fruits, low-fat dairy products, and lean protein.  Do not eat a lot of foods that are high in solid fats, added sugars, or sodium.  Maintain a healthy weight  Body mass index (BMI) is a measurement that can be used to identify possible weight problems. It estimates body fat based on height and weight. Your health care provider can help determine your BMI and help you achieve or maintain a healthy weight.  Get regular exercise  Get regular exercise. This is one of the most important things you can do for your health. Most adults should:  Exercise for at least 150 minutes each week. The exercise should increase your heart rate and make you sweat (moderate-intensity exercise).  Do strengthening exercises at least twice a week. This is in addition to the moderate-intensity exercise.  Spend less time sitting. Even light physical activity can be beneficial.  Watch cholesterol and blood lipids  Have your blood tested for lipids and cholesterol at 43 years of age, then have this test every 5 years.  You may need to have your cholesterol levels checked more often if:  Your lipid or cholesterol levels are high.  You are older than 43 years of age.  You are at high risk for heart disease.  What should I know about cancer screening?  Many types of cancers can be detected early and may often be prevented. Depending on your health history and family history, you may need to have cancer screening at various ages. This may include screening for:  Colorectal cancer.  Prostate cancer.  Skin cancer.  Lung  cancer.  What should I know about heart disease, diabetes, and high blood pressure?  Blood pressure and heart disease  High blood pressure causes heart disease and increases the risk of stroke. This is more likely to develop in people who have high blood pressure readings or are overweight.  Talk with your health care provider about your target blood pressure readings.  Have your blood pressure checked:  Every 3-5 years if you are 9-95 years of age.  Every year if you are 85 years old or older.  If you are between the ages of 29 and 29 and are a current or former smoker, ask your health care provider if you should have a one-time screening for abdominal aortic aneurysm (AAA).  Diabetes  Have regular diabetes screenings. This checks your fasting blood sugar level. Have the screening done:  Once every three years after age 23 if you are at a normal weight and have a low risk for diabetes.  More often and at a younger age if you are overweight or have a high risk for diabetes.  What should I know about preventing infection?  Hepatitis B  If you have a higher risk for hepatitis B, you should be screened for this virus. Talk with your health care provider to find out if you are at risk for hepatitis B infection.  Hepatitis C  Blood testing is recommended for:  Everyone born from [birth year] through 1965.  Anyone  with known risk factors for hepatitis C.  Sexually transmitted infections (STIs)  You should be screened each year for STIs, including gonorrhea and chlamydia, if:  You are sexually active and are younger than 43 years of age.  You are older than 43 years of age and your health care provider tells you that you are at risk for this type of infection.  Your sexual activity has changed since you were last screened, and you are at increased risk for chlamydia or gonorrhea. Ask your health care provider if you are at risk.  Ask your health care provider about whether you are at high risk for HIV. Your health care provider  may recommend a prescription medicine to help prevent HIV infection. If you choose to take medicine to prevent HIV, you should first get tested for HIV. You should then be tested every 3 months for as long as you are taking the medicine.  Follow these instructions at home:  Alcohol use  Do not drink alcohol if your health care provider tells you not to drink.  If you drink alcohol:  Limit how much you have to 0-2 drinks a day.  Know how much alcohol is in your drink. In the [place] one drink equals one 12 oz bottle of beer (355 mL), one 5 oz glass of wine (148 mL), or one 1 oz glass of hard liquor (44 mL).  Lifestyle  Do not use any products that contain nicotine or tobacco. These products include cigarettes, chewing tobacco, and vaping devices, such as e-cigarettes. If you need help quitting, ask your health care provider.  Do not use street drugs.  Do not share needles.  Ask your health care provider for help if you need support or information about quitting drugs.  General instructions  Schedule regular health, dental, and eye exams.  Stay current with your vaccines.  Tell your health care provider if:  You often feel depressed.  You have ever been abused or do not feel safe at home.  Summary  Adopting a healthy lifestyle and getting preventive care are important in promoting health and wellness.  Follow your health care provider's instructions about healthy diet, exercising, and getting tested or screened for diseases.  Follow your health care provider's instructions on monitoring your cholesterol and blood pressure.  This information is not intended to replace advice given to you by your health care provider. Make sure you discuss any questions you have with your health care provider.  Document Revised: 05/30/2020 Document Reviewed: 05/30/2020  Elsevier Patient Education  2024 [employer].

## 2023-08-06 ENCOUNTER — Encounter: Payer: Self-pay | Admitting: Neurology

## 2023-08-06 ENCOUNTER — Ambulatory Visit (INDEPENDENT_AMBULATORY_CARE_PROVIDER_SITE_OTHER): Admitting: Neurology

## 2023-08-06 VITALS — BP 119/74 | HR 58 | Ht 72.0 in | Wt 245.0 lb

## 2023-08-06 DIAGNOSIS — Z820 Family history of epilepsy and other diseases of the nervous system: Secondary | ICD-10-CM | POA: Diagnosis not present

## 2023-08-06 DIAGNOSIS — G4719 Other hypersomnia: Secondary | ICD-10-CM | POA: Diagnosis not present

## 2023-08-06 DIAGNOSIS — Z9189 Other specified personal risk factors, not elsewhere classified: Secondary | ICD-10-CM | POA: Diagnosis not present

## 2023-08-06 DIAGNOSIS — E66811 Obesity, class 1: Secondary | ICD-10-CM

## 2023-08-06 DIAGNOSIS — R635 Abnormal weight gain: Secondary | ICD-10-CM

## 2023-08-06 DIAGNOSIS — R0683 Snoring: Secondary | ICD-10-CM

## 2023-08-06 NOTE — Progress Notes (Signed)
**Note Shawn-Identified via Obfuscation**  Subjective:    Patient ID: Shawn Logan is a 43 y.o. male.  HPI    True Mar, MD, PhD [facility] [address], [address] [address] [place], [place] [address]  Dear Dr. Purcell,   I saw your patient, Shawn Logan, upon your kind request in my sleep clinic today for initial consultation of his sleep disorder, in particular, concern for underlying obstructive sleep apnea.  The patient is unaccompanied today.  As you know, [name] is a 43 year old male with an underlying medical history of anxiety, depression, and obesity, who reports snoring and excessive daytime somnolence.  His Epworth sleepiness score is 14 out of 24, fatigue severity score is 45 out of 63. I reviewed your office note from 07/03/2023.  He reports a family history of sleep apnea on his mother side. He lives with his wife and 2 young children, ages 20 and 1.  The 63-year-old sleeps in a toddler bed in their bedroom.  They do have a TV in the bedroom and it tends to stay on all night.  They have 2 dogs in the household and the dogs do not sleep in their bedroom at night.  He has had some weight gain in the recent past, about 25 pounds within the past year.  He drinks quite a bit of caffeine in the form of sweet tea, about 6 cups/day, occasional soda.  He drinks alcohol infrequently, less than once a week.  He quit smoking over a year ago.  He denies recurrent nocturnal or morning headaches or nightly nocturia.  He works as a Contractor.  His Past Medical History Is Significant For: Past Medical History:  Diagnosis Date   Anxiety    Depression     His Past Surgical History Is Significant For: History reviewed. No pertinent surgical history.  His Family History Is Significant For: Family History  Problem Relation Age of Onset   Alcohol abuse Father    Drug abuse Sister        died 6 weeks ago   Cancer Maternal Grandmother    Cancer Maternal Grandfather        lung   Sleep apnea  Maternal Uncle    Sleep apnea Maternal Uncle     His Social History Is Significant For: Social History   Socioeconomic History   Marital status: Married    Spouse name: Shawn Logan   Number of children: 1   Years of education: Not on file   Highest education level: Not on file  Occupational History   Not on file  Tobacco Use   Smoking status: Former    Current packs/day: 0.00    Average packs/day: 1.5 packs/day for 25.5 years (38.2 ttl pk-yrs)    Types: Cigarettes    Start date: 1999    Quit date: 07/18/2022    Years since quitting: 1.0   Smokeless tobacco: Never   Tobacco comments:    Zen pouch  Vaping Use   Vaping status: Never Used  Substance and Sexual Activity   Alcohol use: Not Currently    Comment: weekend, states 24 drinks per year maybe now   Drug use: No   Sexual activity: Yes  Other Topics Concern   Not on file  Social History Narrative   Right handed   Caffeine: 3-4 cups/day   Lives with wife and 2 children   Social Drivers of Corporate investment banker Strain: Not on file  Food Insecurity: Not on  file  Transportation Needs: Not on file  Physical Activity: Not on file  Stress: Not on file  Social Connections: Not on file    His Allergies Are:  No Known Allergies:   His Current Medications Are:  Outpatient Encounter Medications as of 08/06/2023  Medication Sig   sertraline  (ZOLOFT ) 50 MG tablet Take 1 tablet (50 mg total) by mouth daily.   No facility-administered encounter medications on file as of 08/06/2023.  :   Review of Systems:  Out of a complete 14 point review of systems, all are reviewed and negative with the exception of these symptoms as listed below:  Review of Systems  Neurological:        Patient is here alone for sleep consult. He endorses snoring, daytime sleepiness. He has never had a sleep study. He stays up later than he should but he reports once he lies down he falls asleep with no problem. All of his uncles on his mother's  side have sleep apnea. ESS 11 FSS 44  1  Objective:  Neurological Exam  Physical Exam Physical Examination:   Vitals:   08/06/23 1121  BP: 119/74  Pulse: (!) 58    General Examination: The patient is a very pleasant 43 y.o. male in no acute distress. He appears well-developed and well-nourished and well groomed.   HEENT: Normocephalic, atraumatic, pupils are equal, round and reactive to light, extraocular tracking is good without limitation to gaze excursion or nystagmus noted. Hearing is grossly intact. Face is symmetric with normal facial animation. Speech is clear with no dysarthria noted. There is no hypophonia. There is no lip, neck/head, jaw or voice tremor. Neck is supple with full range of passive and active motion. There are no carotid bruits on auscultation. Oropharynx exam reveals: mild mouth dryness, good dental hygiene and moderate to marked airway crowding, due to tonsillar size of about 3+, larger/wider uvula, Mallampati class III.  Neck circumference 17-7/8 inches, minimal overbite noted.  Tongue protrudes centrally and palate elevates symmetrically.  Chest: Clear to auscultation without wheezing, rhonchi or crackles noted.  Heart: S1+S2+0, regular and normal without murmurs, rubs or gallops noted.   Abdomen: Soft, non-tender and non-distended.  Extremities: There is no pitting edema in the distal lower extremities bilaterally.   Skin: Warm and dry without trophic changes noted.   Musculoskeletal: exam reveals no obvious joint deformities.   Neurologically:  Mental status: The patient is awake, alert and oriented in all 4 spheres. His immediate and remote memory, attention, language skills and fund of knowledge are appropriate. There is no evidence of aphasia, agnosia, apraxia or anomia. Speech is clear with normal prosody and enunciation. Thought process is linear. Mood is normal and affect is normal.  Cranial nerves II - XII are as described above under HEENT exam.   Motor exam: Normal bulk, strength and tone is noted. There is no obvious action or resting tremor.  Fine motor skills and coordination: grossly intact.  Cerebellar testing: No dysmetria or intention tremor. There is no truncal or gait ataxia.  Sensory exam: intact to light touch in the upper and lower extremities.  Gait, station and balance: He stands easily. No veering to one side is noted. No leaning to one side is noted. Posture is age-appropriate and stance is narrow based. Gait shows normal stride length and normal pace. No problems turning are noted.   Assessment and Plan:  In summary, Shawn Logan is a very pleasant 43 y.o.-year old male with an underlying  medical history of anxiety, depression, and obesity, whose history and physical exam are concerning for sleep disordered breathing, particularly obstructive sleep apnea (OSA). A laboratory attended sleep study is typically considered gold standard for evaluation of sleep disordered breathing.   I had a long chat with the patient about my findings and the diagnosis of sleep apnea, particularly OSA, its prognosis and treatment options. We talked about medical/conservative treatments, surgical interventions and non-pharmacological approaches for symptom control. I explained, in particular, the risks and ramifications of untreated moderate to severe OSA, especially with respect to developing cardiovascular disease down the road, including congestive heart failure (CHF), difficult to treat hypertension, cardiac arrhythmias (particularly A-fib), neurovascular complications including TIA, stroke and dementia. Even type 2 diabetes has, in part, been linked to untreated OSA. Symptoms of untreated OSA may include (but may not be limited to) daytime sleepiness, nocturia (i.e. frequent nighttime urination), memory problems, mood irritability and suboptimally controlled or worsening mood disorder such as depression and/or anxiety, lack of energy, lack of  motivation, physical discomfort, as well as recurrent headaches, especially morning or nocturnal headaches. We talked about the importance of maintaining a healthy lifestyle and striving for healthy weight. In addition, we talked about the importance of striving for and maintaining good sleep hygiene. I recommended a sleep study at this time. I outlined the differences between a laboratory attended sleep study which is considered more comprehensive and accurate over the option of a home sleep test (HST); the latter may lead to underestimation of sleep disordered breathing in some instances and does not help with diagnosing upper airway resistance syndrome and is not accurate enough to diagnose primary central sleep apnea typically. I outlined possible surgical and non-surgical treatment options of OSA, including the use of a positive airway pressure (PAP) device (i.e. CPAP, AutoPAP/APAP or BiPAP in certain circumstances), a custom-made dental device (aka oral appliance, which would require a referral to a specialist dentist or orthodontist typically, and is generally speaking not considered for patients with full dentures or edentulous state), upper airway surgical options, such as traditional UPPP (which is not considered a first-line treatment) or the Inspire device (hypoglossal nerve stimulator, which would involve a referral for consultation with an ENT surgeon, after careful selection, following inclusion criteria - also not first-line treatment). I explained the PAP treatment option to the patient in detail, as this is generally considered first-line treatment.  The patient indicated that he would be willing to try PAP therapy, if the need arises. I explained the importance of being compliant with PAP treatment, not only for insurance purposes but primarily to improve patient's symptoms symptoms, and for the patient's long term health benefit, including to reduce His cardiovascular risks longer-term.    We  will pick up our discussion about the next steps and treatment options after testing.  We will keep him posted as to the test results by phone call and/or MyChart messaging where possible.  We will plan to follow-up in sleep clinic accordingly as well.  I answered all his questions today and the patient was in agreement.   I encouraged him to call with any interim questions, concerns, problems or updates or email us  through MyChart.  Generally speaking, sleep test authorizations may take up to 2 weeks, sometimes less, sometimes longer, the patient is encouraged to get in touch with us  if they do not hear back from the sleep lab staff directly within the next 2 weeks.  Thank you very much for allowing me to participate  in the care of this nice patient. If I can be of any further assistance to you please do not hesitate to call me at [phone].  Sincerely,   True Mar, MD, PhD

## 2023-08-06 NOTE — Patient Instructions (Signed)

## 2023-08-09 ENCOUNTER — Telehealth: Payer: Self-pay | Admitting: Neurology

## 2023-08-09 NOTE — Telephone Encounter (Signed)
 NPSG [employer] pending

## 2023-08-15 NOTE — Telephone Encounter (Signed)
 Checked status on the portal it is still pending.

## 2023-08-20 NOTE — Telephone Encounter (Signed)
Checked status it is still pending.  

## 2023-08-26 NOTE — Telephone Encounter (Signed)
Checked status it is still pending.  

## 2023-09-02 NOTE — Telephone Encounter (Signed)
 Checked status on the portal it is still pending

## 2023-09-03 NOTE — Telephone Encounter (Signed)
 HST [employer] no auth req  NPSG is denied see below for the reason.

## 2023-09-09 ENCOUNTER — Ambulatory Visit: Admitting: Emergency Medicine

## 2023-09-09 ENCOUNTER — Encounter: Payer: Self-pay | Admitting: Emergency Medicine

## 2023-09-09 VITALS — BP 122/90 | HR 70 | Temp 98.1°F | Ht 72.0 in | Wt 234.0 lb

## 2023-09-09 DIAGNOSIS — F32A Depression, unspecified: Secondary | ICD-10-CM

## 2023-09-09 DIAGNOSIS — R1010 Upper abdominal pain, unspecified: Secondary | ICD-10-CM | POA: Diagnosis not present

## 2023-09-09 DIAGNOSIS — R112 Nausea with vomiting, unspecified: Secondary | ICD-10-CM | POA: Insufficient documentation

## 2023-09-09 LAB — CBC WITH DIFFERENTIAL/PLATELET
Basophils Absolute: 0.1 K/uL (ref 0.0–0.1)
Basophils Relative: 1 % (ref 0.0–3.0)
Eosinophils Absolute: 0.5 K/uL (ref 0.0–0.7)
Eosinophils Relative: 5.5 % — ABNORMAL HIGH (ref 0.0–5.0)
HCT: 46.9 % (ref 39.0–52.0)
Hemoglobin: 15.7 g/dL (ref 13.0–17.0)
Lymphocytes Relative: 35.7 % (ref 12.0–46.0)
Lymphs Abs: 3.5 K/uL (ref 0.7–4.0)
MCHC: 33.5 g/dL (ref 30.0–36.0)
MCV: 81.8 fl (ref 78.0–100.0)
Monocytes Absolute: 0.8 K/uL (ref 0.1–1.0)
Monocytes Relative: 8 % (ref 3.0–12.0)
Neutro Abs: 4.8 K/uL (ref 1.4–7.7)
Neutrophils Relative %: 49.8 % (ref 43.0–77.0)
Platelets: 291 K/uL (ref 150.0–400.0)
RBC: 5.74 Mil/uL (ref 4.22–5.81)
RDW: 13.6 % (ref 11.5–15.5)
WBC: 9.7 K/uL (ref 4.0–10.5)

## 2023-09-09 LAB — COMPREHENSIVE METABOLIC PANEL WITH GFR
ALT: 21 U/L (ref 0–53)
AST: 14 U/L (ref 0–37)
Albumin: 4.2 g/dL (ref 3.5–5.2)
Alkaline Phosphatase: 94 U/L (ref 39–117)
BUN: 11 mg/dL (ref 6–23)
CO2: 27 meq/L (ref 19–32)
Calcium: 8.9 mg/dL (ref 8.4–10.5)
Chloride: 103 meq/L (ref 96–112)
Creatinine, Ser: 0.98 mg/dL (ref 0.40–1.50)
GFR: 94.67 mL/min (ref >60.00)
Glucose, Bld: 91 mg/dL (ref 70–99)
Potassium: 3.7 meq/L (ref 3.5–5.1)
Sodium: 139 meq/L (ref 135–145)
Total Bilirubin: 0.5 mg/dL (ref 0.2–1.2)
Total Protein: 7.1 g/dL (ref 6.0–8.3)

## 2023-09-09 LAB — LIPASE: Lipase: 39 U/L (ref 11.0–59.0)

## 2023-09-09 MED ORDER — SERTRALINE HCL 100 MG PO TABS: 100.0000 mg | ORAL_TABLET | Freq: Every day | ORAL | 3 refills | Status: DC

## 2023-09-09 NOTE — Assessment & Plan Note (Signed)
 Clinically stable.  No Shawn Logan flag signs or symptoms. Benign abdominal examination Differential diagnosis discussed Zoloft  withdrawal symptoms contributing Diet and nutrition discussed Recommend blood work today

## 2023-09-09 NOTE — Assessment & Plan Note (Addendum)
 Chronic depression.  Contributing to his symptoms. Improved with Zoloft  Recommend to increase dose to 100 mg daily

## 2023-09-09 NOTE — Assessment & Plan Note (Signed)
 Clinically stable.  No Saladin flag signs or symptoms Recommend blood work today Differential diagnosis discussed Benign physical examination ED precautions given May take Tylenol  as needed for pain

## 2023-09-09 NOTE — Patient Instructions (Signed)
 Abdominal Pain, Adult    Many things can cause belly (abdominal) pain. In most cases, belly pain is not a serious problem and can be watched and treated at home. But in some cases, it can be serious.  Your doctor will try to find the cause of your belly pain.  Follow these instructions at home:  Medicines  Take over-the-counter and prescription medicines only as told by your doctor.  Do not take medicines that help you poop (laxatives) unless told by your doctor.  General instructions  Watch your belly pain for any changes. Tell your doctor if the pain gets worse.  Drink enough fluid to keep your pee (urine) pale yellow.  Contact a doctor if:  Your belly pain changes or gets worse.  You have very bad cramping or bloating in your belly.  You vomit.  Your pain gets worse with meals, after eating, or with certain foods.  You have trouble pooping or have watery poop for more than 2-3 days.  You are not hungry, or you lose weight without trying.  You have signs of not getting enough fluid or water (dehydration). These may include:  Dark pee, very Chastain pee, or no pee.  Cracked lips or dry mouth.  Feeling sleepy or weak.  You have pain when you pee or poop.  Your belly pain wakes you up at night.  You have blood in your pee.  You have a fever.  Get help right away if:  You cannot stop vomiting.  Your pain is only in one part of your belly, like on the right side.  You have bloody or black poop, or poop that looks like tar.  You have trouble breathing.  You have chest pain.  These symptoms may be an emergency. Get help right away. Call 911.  Do not wait to see if the symptoms will go away.  Do not drive yourself to the hospital.  This information is not intended to replace advice given to you by your health care provider. Make sure you discuss any questions you have with your health care provider.  Document Revised: 10/25/2021 Document Reviewed: 10/25/2021  Elsevier Patient Education  2024 [employer].

## 2023-09-09 NOTE — Progress Notes (Signed)
 Shawn Logan 43 y.o.   Chief Complaint  Patient presents with   Abdominal Pain    Patient states he's been having nausea, vomiting, and stomach ache . States he thinks it because he missed a couple of days of his sertraline . Patient is having head aches, nausea, no fever. He is taking ibuprofen states it helped a little      HISTORY OF PRESENT ILLNESS: This is a 43 y.o. male felt nauseous and threw up a couple of times 2 weeks ago after missing 3 days of his regular medications Zoloft . It happened again yesterday.  Thinks it is his anxiety and depression kicking in. Denies alcohol use.  Non-smoker.  No other associated symptoms. No other complaints or medical concerns today.  Abdominal Pain Associated symptoms include nausea and vomiting. Pertinent negatives include no diarrhea, dysuria, fever, headaches or hematuria.     Prior to Admission medications   Medication Sig Start Date End Date Taking? Authorizing Provider  sertraline  (ZOLOFT ) 100 MG tablet Take 1 tablet (100 mg total) by mouth daily. 09/09/23  Yes Purcell Emil Schanz, MD    No Known Allergies  Patient Active Problem List   Diagnosis Date Noted   Suspected sleep apnea 07/03/2023   Prediabetes 05/16/2021   Chronic depression 05/24/2017   Alcohol use    Dyslipidemia     Past Medical History:  Diagnosis Date   Anxiety    Depression     No past surgical history on file.  Social History   Socioeconomic History   Marital status: Married    Spouse name: Silvano   Number of children: 1   Years of education: Not on file   Highest education level: Not on file  Occupational History   Not on file  Tobacco Use   Smoking status: Former    Current packs/day: 0.00    Average packs/day: 1.5 packs/day for 25.5 years (38.2 ttl pk-yrs)    Types: Cigarettes    Start date: 53    Quit date: 07/18/2022    Years since quitting: 1.1   Smokeless tobacco: Never   Tobacco comments:    Zen pouch  Vaping Use   Vaping  status: Never Used  Substance and Sexual Activity   Alcohol use: Not Currently    Comment: weekend, states 24 drinks per year maybe now   Drug use: No   Sexual activity: Yes  Other Topics Concern   Not on file  Social History Narrative   Right handed   Caffeine: 3-4 cups/day   Lives with wife and 2 children   Social Drivers of Corporate investment banker Strain: Not on file  Food Insecurity: Not on file  Transportation Needs: Not on file  Physical Activity: Not on file  Stress: Not on file  Social Connections: Not on file  Intimate Partner Violence: Not on file    Family History  Problem Relation Age of Onset   Alcohol abuse Father    Drug abuse Sister        died 6 weeks ago   Cancer Maternal Grandmother    Cancer Maternal Grandfather        lung   Sleep apnea Maternal Uncle    Sleep apnea Maternal Uncle      Review of Systems  Constitutional: Negative.  Negative for chills and fever.  HENT: Negative.  Negative for congestion and sore throat.   Respiratory: Negative.  Negative for cough and shortness of breath.   Cardiovascular:  Negative  for chest pain and palpitations.  Gastrointestinal:  Positive for abdominal pain, nausea and vomiting. Negative for diarrhea.  Genitourinary: Negative.  Negative for dysuria and hematuria.  Skin: Negative.  Negative for rash.  Neurological: Negative.  Negative for dizziness and headaches.  All other systems reviewed and are negative.   Vitals:   09/09/23 1604  BP: (!) 122/90  Pulse: 70  Temp: 98.1 F (36.7 C)  SpO2: 96%    Physical Exam Vitals reviewed.  Constitutional:      Appearance: He is well-developed.  HENT:     Head: Normocephalic.     Mouth/Throat:     Mouth: Mucous membranes are moist.     Pharynx: Oropharynx is clear.  Eyes:     Extraocular Movements: Extraocular movements intact.     Conjunctiva/sclera: Conjunctivae normal.     Pupils: Pupils are equal, round, and reactive to light.  Cardiovascular:      Rate and Rhythm: Normal rate and regular rhythm.     Pulses: Normal pulses.     Heart sounds: Normal heart sounds.  Pulmonary:     Effort: Pulmonary effort is normal.     Breath sounds: Normal breath sounds.  Abdominal:     Palpations: Abdomen is soft.     Tenderness: There is no abdominal tenderness.  Musculoskeletal:     Cervical back: No tenderness.  Lymphadenopathy:     Cervical: No cervical adenopathy.  Skin:    General: Skin is warm and dry.     Capillary Refill: Capillary refill takes less than 2 seconds.  Neurological:     General: No focal deficit present.     Mental Status: He is alert and oriented to person, place, and time.  Psychiatric:        Mood and Affect: Mood normal.        Behavior: Behavior normal.      ASSESSMENT & PLAN: A total of 44 minutes was spent with the patient and counseling/coordination of care regarding preparing for this visit, review of most recent office visit notes, review of multiple chronic medical conditions and their management, differential diagnosis of upper abdominal pain and need for workup, symptom management, review of all medications and changes made, review of most recent bloodwork results, review of health maintenance items, education on nutrition, prognosis, documentation, and need for follow up.   Problem List Items Addressed This Visit       Digestive   Nausea and vomiting   Clinically stable.  No Agnes flag signs or symptoms. Benign abdominal examination Differential diagnosis discussed Zoloft  withdrawal symptoms contributing Diet and nutrition discussed Recommend blood work today      Relevant Orders   CBC with Differential/Platelet   Comprehensive metabolic panel with GFR   Lipase     Other   Chronic depression   Chronic depression.  Contributing to his symptoms. Improved with Zoloft  Recommend to increase dose to 100 mg daily      Relevant Medications   sertraline  (ZOLOFT ) 100 MG tablet   Pain of upper  abdomen - Primary   Clinically stable.  No St[place]man flag signs or symptoms Recommend blood work today Differential diagnosis discussed Benign physical examination ED precautions given May take Tylenol  as needed for pain      Patient Instructions  Abdominal Pain, Adult  Many things can cause belly (abdominal) pain. In most cases, belly pain is not a serious problem and can be watched and treated at home. But in some cases, it can be  serious. Your doctor will try to find the cause of your belly pain. Follow these instructions at home: Medicines Take over-the-counter and prescription medicines only as told by your doctor. Do not take medicines that help you poop (laxatives) unless told by your doctor. General instructions Watch your belly pain for any changes. Tell your doctor if the pain gets worse. Drink enough fluid to keep your pee (urine) pale yellow. Contact a doctor if: Your belly pain changes or gets worse. You have very bad cramping or bloating in your belly. You vomit. Your pain gets worse with meals, after eating, or with certain foods. You have trouble pooping or have watery poop for more than 2-3 days. You are not hungry, or you lose weight without trying. You have signs of not getting enough fluid or water (dehydration). These may include: Dark pee, very little pee, or no pee. Cracked lips or dry mouth. Feeling sleepy or weak. You have pain when you pee or poop. Your belly pain wakes you up at night. You have blood in your pee. You have a fever. Get help right away if: You cannot stop vomiting. Your pain is only in one part of your belly, like on the right side. You have bloody or black poop, or poop that looks like tar. You have trouble breathing. You have chest pain. These symptoms may be an emergency. Get help right away. Call 911. Do not wait to see if the symptoms will go away. Do not drive yourself to the hospital. This information is not intended to replace  advice given to you by your health care provider. Make sure you discuss any questions you have with your health care provider. Document Revised: 10/25/2021 Document Reviewed: 10/25/2021 Elsevier Patient Education  2024 Elsevier Inc.      Emil Schaumann, MD [facility]

## 2023-09-10 ENCOUNTER — Ambulatory Visit: Payer: Self-pay | Admitting: Emergency Medicine

## 2023-09-10 ENCOUNTER — Telehealth: Payer: Self-pay

## 2023-09-10 NOTE — Telephone Encounter (Signed)
 Noted~  Copied from CRM [id number]. Topic: Clinical - Lab/Test Results >> Sep 10, 2023 10:32 AM Rea BROCKS wrote: Reason for CRM: Patient returned call. Relayed lab results to him.

## 2023-09-27 ENCOUNTER — Ambulatory Visit (INDEPENDENT_AMBULATORY_CARE_PROVIDER_SITE_OTHER): Admitting: Neurology

## 2023-09-27 DIAGNOSIS — E66811 Obesity, class 1: Secondary | ICD-10-CM

## 2023-09-27 DIAGNOSIS — G4719 Other hypersomnia: Secondary | ICD-10-CM

## 2023-09-27 DIAGNOSIS — G4733 Obstructive sleep apnea (adult) (pediatric): Secondary | ICD-10-CM

## 2023-09-27 DIAGNOSIS — Z820 Family history of epilepsy and other diseases of the nervous system: Secondary | ICD-10-CM

## 2023-09-27 DIAGNOSIS — R0683 Snoring: Secondary | ICD-10-CM

## 2023-09-27 DIAGNOSIS — Z9189 Other specified personal risk factors, not elsewhere classified: Secondary | ICD-10-CM

## 2023-09-27 DIAGNOSIS — G4734 Idiopathic sleep related nonobstructive alveolar hypoventilation: Secondary | ICD-10-CM

## 2023-09-27 DIAGNOSIS — R635 Abnormal weight gain: Secondary | ICD-10-CM

## 2023-10-02 ENCOUNTER — Other Ambulatory Visit: Payer: Self-pay | Admitting: Emergency Medicine

## 2023-10-02 DIAGNOSIS — F32A Depression, unspecified: Secondary | ICD-10-CM

## 2023-10-08 NOTE — Progress Notes (Unsigned)
 SABRA

## 2023-10-09 ENCOUNTER — Ambulatory Visit: Payer: Self-pay | Admitting: Neurology

## 2023-10-09 DIAGNOSIS — G4733 Obstructive sleep apnea (adult) (pediatric): Secondary | ICD-10-CM

## 2023-10-09 DIAGNOSIS — G4734 Idiopathic sleep related nonobstructive alveolar hypoventilation: Secondary | ICD-10-CM

## 2023-10-09 NOTE — Procedures (Signed)
 [facility]  HOME SLEEP TEST (SANSA) REPORT (Mail-Out Device):   STUDY DATE: 10/02/2023  DOB: Jan 01, 1981  MRN: [id number]  ORDERING CLINICIAN: True Mar, MD, PhD   REFERRING CLINICIAN: Purcell Emil Schanz, MD   CLINICAL INFORMATION/HISTORY (obtained from visit note dated 08/06/2023): 43 year old male with an underlying medical history of anxiety, depression, and obesity, who reports snoring and excessive daytime somnolence.   PATIENT'S LAST REPORTED EPWORTH SLEEPINESS SCORE (ESS): 14/24.  BMI (at the time of sleep clinic visit and/or test date): 33.2 kg/m  FINDINGS:   Study Protocol:    The SANSA single-point-of-skin-contact chest-worn sensor - an FDA cleared and DOT approved type 4 home sleep test device - measures eight physiological channels,  including blood oxygen saturation (measured via PPG [photoplethysmography]), EKG-derived heart rate, respiratory effort, chest movement (measured via accelerometer), snoring, body position, and actigraphy. The device is designed to be worn for up to 10 hours per study.   Sleep Summary:   Total Recording Time (hours, min): 8 hours, 10 min  Total Effective Sleep Time (hours, min):  5 hours, 12 min  Sleep Efficiency (%):    64%   Respiratory Indices:   Calculated sAHI (per hour):  43.9/hour         Oxygen Saturation Statistics:    Oxygen Saturation (%) Mean: 94.4%   Minimum oxygen saturation (%):                 64%   O2 Saturation Range (%): 64-100%   Time below or at 88% saturation: 14 min   Pulse Rate Statistics:   Pulse Mean (bpm):    61/min    Pulse Range (50-107/min)   Snoring: Mild to loud  IMPRESSION/DIAGNOSES:   OSA (obstructive sleep apnea), severe Nocturnal Hypoxemia  RECOMMENDATIONS:   This home sleep test demonstrates severe obstructive sleep apnea with a total AHI of 43.9/hour and O2 nadir of 64% with significant time below or at 88% saturation of over 10 minutes for the  study, indicating nocturnal hypoxemia. Snoring was detected, in the mild to loud range.  Treatment with positive airway pressure is highly recommended. The patient will be advised to proceed with an autoPAP titration/trial at home. A laboratory attended titration study can be considered in the future for optimization of treatment settings and to improve tolerance and compliance, if needed, down the road. Alternative treatment options are limited secondary to the severity of the patient's sleep disordered breathing, but may include surgical treatment with an implantable hypoglossal nerve stimulator (in carefully selected candidates, meeting criteria).  Concomitant weight loss is recommended (where clinically appropriate). Please note, that untreated obstructive sleep apnea may carry additional perioperative morbidity. Patients with significant obstructive sleep apnea should receive perioperative PAP therapy and the surgeons and particularly the anesthesiologist should be informed of the diagnosis and the severity of the sleep disordered breathing. The patient should be cautioned not to drive, work at heights, or operate dangerous or heavy equipment when tired or sleepy. Review and reiteration of good sleep hygiene measures should be pursued with any patient. Other causes of the patient's symptoms, including circadian rhythm disturbances, an underlying mood disorder, medication effect and/or an underlying medical problem cannot be ruled out based on this test. Clinical correlation is recommended.  The patient and his referring provider will be notified of the test results. The patient will be seen in follow up in sleep clinic at [facility].  I certify that I have reviewed the raw data recording prior to the issuance  of this report in accordance with the standards of the American Academy of Sleep Medicine (AASM).    INTERPRETING PHYSICIAN:   True Mar, MD, PhD Medical Director, [facility]) Diplomat, ABPN (Neurology and Sleep)   [facility] [address] [place], [place] [address] [phone]

## 2023-10-10 NOTE — Telephone Encounter (Signed)
-----   Message from True Mar sent at 10/09/2023  6:03 PM EDT ----- Urgent set up requested on PAP therapy, due to severe OSA. Patient referred by PCP, seen by me on 08/06/2023, patient had a HST on 10/02/2023.    Please call and notify the patient that the recent home sleep test showed obstructive sleep apnea in the severe range. I recommend treatment for this in the form of autoPAP, which means, that we  don't have to bring him in for a sleep study with CPAP, but will let him start using a so called autoPAP machine at home, through a DME company (of his choice, or as per insurance requirement). The  DME representative will fit the patient with a mask of choice, educate him on how to use the machine, how to put the mask on, etc. I have placed an order in the chart. Please send the order to a  local DME, talk to patient, send report to referring MD. Please also reinforce the need for compliance with treatment. We will need a FU in sleep clinic for 10 weeks post-PAP set up, please arrange  that with me or one of our NPs. Thanks,   True Mar, MD, PhD [facility])    ----- Message ----- From: Mar True, MD Sent: 10/09/2023   6:01 PM EDT To: True Mar, MD

## 2023-10-10 NOTE — Telephone Encounter (Signed)
 Attempted to reach pt. VM full. Spoke with wife [place] (on [place]). She will have patient call us .

## 2023-10-10 NOTE — Telephone Encounter (Signed)
-----   Message from Shawn Logan sent at 10/09/2023  6:03 PM EDT ----- Urgent set up requested on PAP therapy, due to severe OSA. Patient referred by PCP, seen by me on 08/06/2023, patient had a HST on 10/02/2023.    Please call and notify the patient that the recent home sleep test showed obstructive sleep apnea in the severe range. I recommend treatment for this in the form of autoPAP, which means, that we  don't have to bring him in for a sleep study with CPAP, but will let him start using a so called autoPAP machine at home, through a DME company (of his choice, or as per insurance requirement). The  DME representative will fit the patient with a mask of choice, educate him on how to use the machine, how to put the mask on, etc. I have placed an order in the chart. Please send the order to a  local DME, talk to patient, send report to referring MD. Please also reinforce the need for compliance with treatment. We will need a FU in sleep clinic for 10 weeks post-PAP set up, please arrange  that with me or one of our NPs. Thanks,   Shawn Mar, MD, PhD [facility])    ----- Message ----- From: Logan True, MD Sent: 10/09/2023   6:01 PM EDT To: Shawn Mar, MD

## 2023-10-10 NOTE — Telephone Encounter (Addendum)
 Spoke to patient gave sleep study results. Pt aware urgent set up due to severe OSA  Pt chose [employer] [facility] as DME Gave Patient DME # Pt aware of insurance compliance  Pt has f/u appointment with Sarah,NP 11/2023 Will send  orders to [employer] and send sleep study results to PCP  this afternoon Pt expressed understanding and thanked me for calling

## 2023-11-26 ENCOUNTER — Encounter: Payer: Self-pay | Admitting: Neurology

## 2023-11-26 ENCOUNTER — Ambulatory Visit: Admitting: Neurology

## 2023-11-26 VITALS — BP 115/72 | HR 72 | Ht 73.0 in | Wt 241.0 lb

## 2023-11-26 DIAGNOSIS — G4733 Obstructive sleep apnea (adult) (pediatric): Secondary | ICD-10-CM | POA: Insufficient documentation

## 2023-11-26 NOTE — Progress Notes (Signed)
 Patient: Shawn Logan Date of Birth: 08/09/1980  Reason for Visit: Follow up History from: Patient Primary Neurologist: Buck  ASSESSMENT AND PLAN 43 y.o. year old male   1.  OSA on CPAP  - Mask refit, current mask is FFM. Comes off every night and is leaking. Order to DME - Recommend nightly use CPAP minimum 4 hours - Continue current settings 7-13 cm water - I will pull a download in 6 weeks to reevaluate the data - We will follow-up in 6 months  - CPAP set up 10/25/23  - HST 10/02/23 showed severe OSA with a total AHI of 43.9/hour and O2 nadir of 64% with significant time below or at 88% saturation of over 10 minutes for the study, indicating nocturnal hypoxemia  HISTORY OF PRESENT ILLNESS: Today 11/26/23 Here for initial CPAP visit. HST 10/02/23 showed severe OSA with a total AHI of 43.9/hour and O2 nadir of 64% with significant time below or at 88% saturation of over 10 minutes for the study, indicating nocturnal hypoxemia. CPAP setup 10/25/23. Almost nightly, wakes up without CPAP mask on, taking off during sleep. 97% use, > 4 hours 0%. Using FFM. He feels the mask is too small. Leak 48.4,  AHI 5.7. Goes to bed 10:30 wakes up around 6 AM.  HISTORY  08/06/23 Dr. Buck: I saw your patient, Shawn Logan, upon your kind request in my sleep clinic today for initial consultation of his sleep disorder, in particular, concern for underlying obstructive sleep apnea.  The patient is unaccompanied today.  As you know, Shawn Logan is a 43 year old male with an underlying medical history of anxiety, depression, and obesity, who reports snoring and excessive daytime somnolence.  His Epworth sleepiness score is 14 out of 24, fatigue severity score is 45 out of 63. I reviewed your office note from 07/03/2023.  He reports a family history of sleep apnea on his mother side. He lives with his wife and 2 young children, ages 26 and 1.  The 67-year-old sleeps in a toddler bed in their bedroom.  They do have a  TV in the bedroom and it tends to stay on all night.  They have 2 dogs in the household and the dogs do not sleep in their bedroom at night.  He has had some weight gain in the recent past, about 25 pounds within the past year.  He drinks quite a bit of caffeine in the form of sweet tea, about 6 cups/day, occasional soda.  He drinks alcohol infrequently, less than once a week.  He quit smoking over a year ago.  He denies recurrent nocturnal or morning headaches or nightly nocturia.  He works as a contractor.  REVIEW OF SYSTEMS: Out of a complete 14 system review of symptoms, the patient complains only of the following symptoms, and all other reviewed systems are negative.  See HPI  ALLERGIES: No Known Allergies  HOME MEDICATIONS: Outpatient Medications Prior to Visit  Medication Sig Dispense Refill   sertraline  (ZOLOFT ) 100 MG tablet TAKE 1 TABLET BY MOUTH EVERY DAY 90 tablet 2   No facility-administered medications prior to visit.    PAST MEDICAL HISTORY: Past Medical History:  Diagnosis Date   Anxiety    Depression     PAST SURGICAL HISTORY: History reviewed. No pertinent surgical history.  FAMILY HISTORY: Family History  Problem Relation Age of Onset   Alcohol abuse Father    Drug abuse Sister  died 6 weeks ago   Cancer Maternal Grandmother    Cancer Maternal Grandfather        lung   Sleep apnea Maternal Uncle    Sleep apnea Maternal Uncle     SOCIAL HISTORY: Social History   Socioeconomic History   Marital status: Married    Spouse name: Silvano   Number of children: 1   Years of education: Not on file   Highest education level: Not on file  Occupational History   Not on file  Tobacco Use   Smoking status: Former    Current packs/day: 0.00    Average packs/day: 1.5 packs/day for 25.5 years (38.2 ttl pk-yrs)    Types: Cigarettes    Start date: 1999    Quit date: 07/18/2022    Years since quitting: 1.3   Smokeless tobacco: Never   Tobacco  comments:    Zen pouch  Vaping Use   Vaping status: Never Used  Substance and Sexual Activity   Alcohol use: Not Currently    Comment: once per month   Drug use: Not on file    Comment: daily   Sexual activity: Yes  Other Topics Concern   Not on file  Social History Narrative   Right handed   Caffeine: 3-4 cups/day   Lives with wife and 2 children   Social Drivers of Corporate Investment Banker Strain: Not on file  Food Insecurity: Not on file  Transportation Needs: Not on file  Physical Activity: Not on file  Stress: Not on file  Social Connections: Not on file  Intimate Partner Violence: Not on file    PHYSICAL EXAM  Vitals:   11/26/23 1239  BP: 115/72  Pulse: 72  SpO2: 98%  Weight: 241 lb (109.3 kg)  Height: 6' 1 (1.854 m)   Body mass index is 31.8 kg/m.  Generalized: Well developed, in no acute distress  Neurological examination  Mentation: Alert oriented to time, place, history taking. Follows all commands speech and language fluent Cranial nerve II-XII: Pupils were equal round reactive to light. Extraocular movements were full, visual field were full on confrontational test. Facial sensation and strength were normal.  Head turning and shoulder shrug  were normal and symmetric. Motor: Moves all extremities independent Gait and station: Gait is normal.   DIAGNOSTIC DATA (LABS, IMAGING, TESTING) - I reviewed patient records, labs, notes, testing and imaging myself where available.  Lab Results  Component Value Date   WBC 9.7 09/09/2023   HGB 15.7 09/09/2023   HCT 46.9 09/09/2023   MCV 81.8 09/09/2023   PLT 291.0 09/09/2023      Component Value Date/Time   NA 139 09/09/2023 1623   NA 140 10/05/2019 1057   K 3.7 09/09/2023 1623   CL 103 09/09/2023 1623   CO2 27 09/09/2023 1623   GLUCOSE 91 09/09/2023 1623   BUN 11 09/09/2023 1623   BUN 11 10/05/2019 1057   CREATININE 0.98 09/09/2023 1623   CALCIUM  8.9 09/09/2023 1623   PROT 7.1 09/09/2023 1623    PROT 6.5 10/05/2019 1057   ALBUMIN 4.2 09/09/2023 1623   ALBUMIN 4.2 10/05/2019 1057   AST 14 09/09/2023 1623   ALT 21 09/09/2023 1623   ALKPHOS 94 09/09/2023 1623   BILITOT 0.5 09/09/2023 1623   BILITOT 0.4 10/05/2019 1057   GFRNONAA 106 10/05/2019 1057   GFRAA 122 10/05/2019 1057   Lab Results  Component Value Date   CHOL 217 (H) 07/03/2023   HDL 39.60  07/03/2023   LDLCALC 126 (H) 07/03/2023   TRIG 254.0 (H) 07/03/2023   CHOLHDL 5 07/03/2023   Lab Results  Component Value Date   HGBA1C 5.8 07/03/2023   No results found for: VITAMINB12 No results found for: TSH  Lauraine Born, AGNP-C, DNP 11/26/2023, 12:52 PM [facility] [address] [place], [place] [address] [phone]

## 2023-11-26 NOTE — Patient Instructions (Signed)
 Great to meet you today I will order a mask refit for CPAP, please contact [facility] Recommend nightly use CPAP minimum 4 hours I will pull a download in 6 weeks to review the data, please feel free to reach out to me sooner if needed Plan to follow-up in 6 months.  Thanks!!

## 2024-01-07 ENCOUNTER — Telehealth: Payer: Self-pay | Admitting: Neurology

## 2024-01-07 NOTE — Telephone Encounter (Signed)
 Please call patient about CPAP report shows 80% usage, greater than 4 hours 23%, mask is leaking slightly at 34, AHI 7.9.  7 to 13 cm water.  Last visit we ordered mask refit. Ensure he had mask refit, recommend nightly usage minimum 4 hours. I suspect his AHI is a little high due to not wearing for very long. If mask is falling off at night without him knowing, needs mask refit or to tighten. Thanks.

## 2024-01-09 NOTE — Telephone Encounter (Signed)
Lvm- 1st attempt

## 2024-01-11 ENCOUNTER — Other Ambulatory Visit: Payer: Self-pay | Admitting: Emergency Medicine

## 2024-01-11 DIAGNOSIS — F32A Depression, unspecified: Secondary | ICD-10-CM

## 2024-01-13 NOTE — Telephone Encounter (Signed)
 Spoke to pt who stated that he called dme but they wouldn't give free mask (not eligible).

## 2024-01-13 NOTE — Telephone Encounter (Signed)
Lvm- 2nd attempt

## 2024-02-05 ENCOUNTER — Ambulatory Visit: Payer: Self-pay

## 2024-02-05 NOTE — Telephone Encounter (Signed)
 Severe abdominal pain needs hospital evaluation.

## 2024-02-05 NOTE — Telephone Encounter (Signed)
 Please advise

## 2024-02-05 NOTE — Telephone Encounter (Signed)
 FYI Only or Action Required?: Action required by provider: request for appointment, clinical question for provider, and update on patient condition.  Patient was last seen in primary care on 09/09/2023 by Purcell Emil Schanz, MD.  Called Nurse Triage reporting Abdominal Pain, Flank Pain, and Bloated.  Symptoms began several weeks ago.  Interventions attempted: Rest, hydration, or home remedies and Other: PCP appt last year.  Symptoms are: gradually worsening.  Triage Disposition: Go to ED Now (or PCP Triage)  Patient/caregiver understands and will follow disposition?: No, refuses disposition        Copied from CRM [id number]. Topic: Clinical - Tuck Word Triage >> Feb 05, 2024  9:10 AM Chasity T wrote: [facility] that prompted transfer to Nurse Triage: Shawn Logan wife of patient is calling because he is having on and off sever stomach pain, vomiting and diarrhea as well well headaches Reason for Disposition  Vomiting  Answer Assessment - Initial Assessment Questions This RN recommended pt be examined in hospital, pt wife refusing, requesting appt and scan for pt. Advised pt call 911 or get to hospital asap if any new or worsening symptoms. Sending message to PCP office for call back to pt with further recommendations. Alerted [facility] to ED refusal.       1. LOCATION: Where does it hurt? (e.g., left, right)     Over weekend, bloating, stomach pain, back pain, felt like had kidney stone at one point, pain around abdomen, even Monday wasn't feeling good  2. ONSET: When did the pain start?     Over weekend  3. SEVERITY: How bad is the pain? (e.g., Scale 1-10; mild, moderate, or severe) felt like had kidney stone at one point     Back pain left side lower no urinary symptoms that know of  6. OTHER SYMPTOMS:  Do you have any other symptoms? (e.g., fever, abdomen pain, vomiting, leg weakness, burning with urination, blood in urine)     Episodes vomiting, diarrhea, sulfur burping -  Seems worse this time, lasting longer, usually just a day, Vomiting and diarrhea not as bad as last time Abdominal pain lasts longer than 10 min Any time eat or drink bloating and abdominal pain Missed work and asking for appt which takes a lot for him Able to keep down water and solids Been consistent with meds Weaker than usual Nausea and headache today  Denies: Changes to awareness/consciousness SOB Chest pain Heart racing Current cold/clammy skin or sweating Blood in diarrhea/vomit per her knowledge Urinary symptoms per her knowledge  Protocols used: Flank Pain-A-AH

## 2024-02-05 NOTE — Telephone Encounter (Signed)
 Called and was unable to LVM

## 2024-06-25 ENCOUNTER — Ambulatory Visit: Admitting: Neurology
